# Patient Record
Sex: Male | Born: 1990 | Race: Black or African American | Hispanic: No | Marital: Married | Smoking: Never smoker
Health system: Southern US, Community
[De-identification: ages and names within clinical notes are randomized; demographics above are authoritative.]

## PROBLEM LIST (undated history)

## (undated) DIAGNOSIS — I10 Essential (primary) hypertension: Secondary | ICD-10-CM

## (undated) DIAGNOSIS — M25579 Pain in unspecified ankle and joints of unspecified foot: Secondary | ICD-10-CM

## (undated) DIAGNOSIS — E669 Obesity, unspecified: Secondary | ICD-10-CM

## (undated) DIAGNOSIS — J020 Streptococcal pharyngitis: Secondary | ICD-10-CM

## (undated) DIAGNOSIS — G43909 Migraine, unspecified, not intractable, without status migrainosus: Secondary | ICD-10-CM

## (undated) DIAGNOSIS — R079 Chest pain, unspecified: Secondary | ICD-10-CM

## (undated) DIAGNOSIS — M7989 Other specified soft tissue disorders: Secondary | ICD-10-CM

## (undated) DIAGNOSIS — M549 Dorsalgia, unspecified: Secondary | ICD-10-CM

## (undated) HISTORY — PX: MOUTH SURGERY: SHX715

## (undated) HISTORY — DX: Pain in unspecified ankle and joints of unspecified foot: M25.579

## (undated) HISTORY — DX: Obesity, unspecified: E66.9

## (undated) HISTORY — DX: Other specified soft tissue disorders: M79.89

## (undated) HISTORY — DX: Chest pain, unspecified: R07.9

## (undated) HISTORY — DX: Dorsalgia, unspecified: M54.9

## (undated) HISTORY — DX: Essential (primary) hypertension: I10

---

## 2013-06-22 ENCOUNTER — Emergency Department (HOSPITAL_COMMUNITY)
Admission: EM | Admit: 2013-06-22 | Discharge: 2013-06-22 | Disposition: A | Discharge status: Home / Self Care | Payer: BC Managed Care – PPO | Attending: Emergency Medicine | Admitting: Emergency Medicine

## 2013-06-22 ENCOUNTER — Encounter (HOSPITAL_COMMUNITY): Payer: Self-pay | Admitting: Emergency Medicine

## 2013-06-22 DIAGNOSIS — J029 Acute pharyngitis, unspecified: Secondary | ICD-10-CM

## 2013-06-22 HISTORY — DX: Streptococcal pharyngitis: J02.0

## 2013-06-22 MED ORDER — IBUPROFEN 600 MG PO TABS: 600.0000 mg | ORAL_TABLET | Freq: Three times a day (TID) | ORAL | Status: DC | PRN

## 2013-06-22 MED ORDER — IBUPROFEN 200 MG PO TABS
600.0000 mg | ORAL_TABLET | Freq: Once | ORAL | Status: AC
  Administered 2013-06-22: 600 mg via ORAL
  Filled 2013-06-22: qty 3

## 2013-06-22 MED ORDER — IBUPROFEN 200 MG PO TABS
200.0000 mg | ORAL_TABLET | Freq: Once | ORAL | Status: DC
  Filled 2013-06-22: qty 1

## 2013-06-22 NOTE — ED Provider Notes (Signed)
CSN: [id number]     Arrival date & time 06/22/13  1006 History   First MD Initiated Contact with Patient 06/22/13 1220     Chief Complaint  Patient presents with  . Sore Throat   HPI Patient reports he was seen several days ago was diagnosed with strep throat and prescribed antibiotics.  He reports ongoing discomfort in his throat.  He also reports new developing right ear pain.  No difficulty breathing or swallowing.  No fevers or chills.  Symptoms are mild in severity.   Past Medical History  Diagnosis Date  . Strep throat    History reviewed. No pertinent past surgical history. Family History  Problem Relation Age of Onset  . Hypertension Mother    History  Substance Use Topics  . Smoking status: Never Smoker   . Smokeless tobacco: Never Used  . Alcohol Use: Yes     Comment: occasionally    Review of Systems  All other systems reviewed and are negative.    Allergies  Review of patient's allergies indicates no known allergies.  Home Medications   Current Outpatient Rx  Name  Route  Sig  Dispense  Refill  . amoxicillin (AMOXIL) 500 MG capsule   Oral   Take 1 capsule by mouth 4 (four) times daily.         Marland Kitchen ibuprofen (ADVIL,MOTRIN) 600 MG tablet   Oral   Take 1 tablet (600 mg total) by mouth every 8 (eight) hours as needed.   15 tablet   0    BP 127/61  Pulse 75  Temp(Src) 97.9 F (36.6 C) (Oral)  Resp 18  Ht 6' (1.829 m)  Wt 272 lb (123.378 kg)  BMI 36.88 kg/m2  SpO2 95% Physical Exam  Nursing note and vitals reviewed. Constitutional: He is oriented to person, place, and time. He appears well-developed and well-nourished.  HENT:  Head: Normocephalic and atraumatic.  Uvula midline.  Posterior pharynx is normal.  No tonsillar swelling or exudate.  Tolerating oral secretions.  Dentition is normal.  Tissue under his tongue is normal and soft.  Bilateral TMs are normal  Eyes: EOM are normal.  Neck: Normal range of motion. Neck supple. No JVD present. No  tracheal deviation present. No thyromegaly present.  Cardiovascular: Normal rate, regular rhythm, normal heart sounds and intact distal pulses.   Pulmonary/Chest: Effort normal and breath sounds normal. No stridor. No respiratory distress.  Abdominal: Soft. He exhibits no distension. There is no tenderness.  Musculoskeletal: Normal range of motion.  Lymphadenopathy:    He has no cervical adenopathy.  Neurological: He is alert and oriented to person, place, and time.  Skin: Skin is warm and dry.  Psychiatric: He has a normal mood and affect. Judgment normal.    ED Course  Procedures (including critical care time) Labs Review Labs Reviewed - No data to display Imaging Review No results found.  EKG Interpretation   None       MDM   1. Sore throat    Overall well appearing.  I do not think the patient needs any imaging of his neck.  Likely ongoing viral pharyngitis.  No signs of peritonsillar abscess at this time.    Lyanne Co, MD 06/22/13 [phone]

## 2013-06-22 NOTE — ED Notes (Signed)
Patient was diagnosed with strep throat 4 days ago and was prescribed antibiotics. Patient c/o no relief from meds given and states the sore throat is worse. Patient also c/o difficulty swallowing due to pain on the right side of throat and right ear.

## 2014-02-26 ENCOUNTER — Emergency Department (HOSPITAL_COMMUNITY)
Admission: EM | Admit: 2014-02-26 | Discharge: 2014-02-27 | Disposition: A | Discharge status: Home / Self Care | Payer: BC Managed Care – PPO | Attending: Emergency Medicine | Admitting: Emergency Medicine

## 2014-02-26 ENCOUNTER — Encounter (HOSPITAL_COMMUNITY): Payer: Self-pay | Admitting: Emergency Medicine

## 2014-02-26 DIAGNOSIS — M7150 Other bursitis, not elsewhere classified, unspecified site: Secondary | ICD-10-CM | POA: Diagnosis not present

## 2014-02-26 DIAGNOSIS — Y929 Unspecified place or not applicable: Secondary | ICD-10-CM | POA: Diagnosis not present

## 2014-02-26 DIAGNOSIS — Z8619 Personal history of other infectious and parasitic diseases: Secondary | ICD-10-CM | POA: Insufficient documentation

## 2014-02-26 DIAGNOSIS — M25519 Pain in unspecified shoulder: Secondary | ICD-10-CM | POA: Diagnosis not present

## 2014-02-26 DIAGNOSIS — X58XXXA Exposure to other specified factors, initial encounter: Secondary | ICD-10-CM | POA: Insufficient documentation

## 2014-02-26 DIAGNOSIS — Z79899 Other long term (current) drug therapy: Secondary | ICD-10-CM | POA: Diagnosis not present

## 2014-02-26 DIAGNOSIS — M7072 Other bursitis of hip, left hip: Secondary | ICD-10-CM

## 2014-02-26 DIAGNOSIS — T148XXA Other injury of unspecified body region, initial encounter: Secondary | ICD-10-CM | POA: Diagnosis not present

## 2014-02-26 DIAGNOSIS — M545 Low back pain, unspecified: Secondary | ICD-10-CM | POA: Diagnosis present

## 2014-02-26 DIAGNOSIS — Y939 Activity, unspecified: Secondary | ICD-10-CM | POA: Diagnosis not present

## 2014-02-26 NOTE — ED Notes (Signed)
Pt c/o low back pain, L hip pain after working x 5 days.

## 2014-02-27 MED ORDER — CYCLOBENZAPRINE HCL 10 MG PO TABS: 10.0000 mg | ORAL_TABLET | Freq: Three times a day (TID) | ORAL | Status: DC | PRN

## 2014-02-27 MED ORDER — MELOXICAM 15 MG PO TABS: 15.0000 mg | ORAL_TABLET | Freq: Every day | ORAL | Status: DC

## 2014-02-27 NOTE — Discharge Instructions (Signed)
You may use heat and ice over your sore areas. Use gentle stretching. Follow up with a primary care provider for continued evaluation and treatment.    Hip Bursitis Bursitis is a puffiness (swelling) and soreness of a fluid-filled sac (bursa). This sac covers and protects the joint. HOME CARE  Put ice on the injured area.  Put ice in a plastic bag.  Place a towel between your skin and the bag.  Leave the ice on for 15-20 minutes, 03-04 times a day.  Rest the painful joint as much as possible. Move your joint at least 4 times a day. When pain lessens, start normal, slow movements and normal activities.  Only take medicine as told by your doctor.  Use crutches as told.  Raise (elevate) your painful joint. Use pillows for propping your legs and hips.  Get a massage to lessen pain. GET HELP RIGHT AWAY IF:  Your pain increases or does not improve during treatment.  You have a fever.  You feel heat coming from the affected area.  You see redness and puffiness around the affected area.  You have any questions or concerns. MAKE SURE YOU:  Understand these instructions.  Will watch your condition.  Will get help right away if you are not well or get worse. Document Released: 08/04/2010 Document Revised: 09/24/2011 Document Reviewed: 08/04/2010 [facility] Patient Information 2015 [place], [place]. This information is not intended to replace advice given to you by your health care provider. Make sure you discuss any questions you have with your health care provider.    Muscle Strain A muscle strain is an injury that occurs when a muscle is stretched beyond its normal length. Usually a small number of muscle fibers are torn when this happens. Muscle strain is rated in degrees. First-degree strains have the least amount of muscle fiber tearing and pain. Second-degree and third-degree strains have increasingly more tearing and pain.  Usually, recovery from muscle strain takes 1-2  weeks. Complete healing takes 5-6 weeks.  CAUSES  Muscle strain happens when a sudden, violent force placed on a muscle stretches it too far. This may occur with lifting, sports, or a fall.  RISK FACTORS Muscle strain is especially common in athletes.  SIGNS AND SYMPTOMS At the site of the muscle strain, there may be:  Pain.  Bruising.  Swelling.  Difficulty using the muscle due to pain or lack of normal function. DIAGNOSIS  Your health care provider will perform a physical exam and ask about your medical history. TREATMENT  Often, the best treatment for a muscle strain is resting, icing, and applying cold compresses to the injured area.  HOME CARE INSTRUCTIONS   Use the PRICE method of treatment to promote muscle healing during the first 2-3 days after your injury. The PRICE method involves:  Protecting the muscle from being injured again.  Restricting your activity and resting the injured body part.  Icing your injury. To do this, put ice in a plastic bag. Place a towel between your skin and the bag. Then, apply the ice and leave it on from 15-20 minutes each hour. After the third day, switch to moist heat packs.  Apply compression to the injured area with a splint or elastic bandage. Be careful not to wrap it too tightly. This may interfere with blood circulation or increase swelling.  Elevate the injured body part above the level of your heart as often as you can.  Only take over-the-counter or prescription medicines for pain, discomfort, or  fever as directed by your health care provider.  Warming up prior to exercise helps to prevent future muscle strains. SEEK MEDICAL CARE IF:   You have increasing pain or swelling in the injured area.  You have numbness, tingling, or a significant loss of strength in the injured area. MAKE SURE YOU:   Understand these instructions.  Will watch your condition.  Will get help right away if you are not doing well or get  worse. Document Released: 07/02/2005 Document Revised: 04/22/2013 Document Reviewed: 01/29/2013 [facility] Patient Information 2015 [place], [place]. This information is not intended to replace advice given to you by your health care provider. Make sure you discuss any questions you have with your health care provider.

## 2014-02-27 NOTE — ED Provider Notes (Signed)
Medical screening examination/treatment/procedure(s) were performed by non-physician practitioner and as supervising physician I was immediately available for consultation/collaboration.   EKG Interpretation None       Derwood KaplanAnkit Dragan Tamburrino, MD 02/27/14 [phone]

## 2014-02-27 NOTE — ED Provider Notes (Signed)
CSN: [id number]     Arrival date & time 02/26/14  2334 History   First MD Initiated Contact with Patient 02/26/14 2351     Chief Complaint  Patient presents with  . Back Pain   HPI  Hx provided by pt. Pt is a 23 yo male with no sig. PMH who presents with complaints of left hip pains, low back pain and right shoulder pains. He has had left hip pains for the past 5 days. Pain is worse with movement and exercise. Pt states he has been jogging more recently prior to symptoms. Pain radiates some into low back area and he has some soreness with bending and moving. Pt also developed right shoulder pains over last 2 days. Pain is also worse with movements. Denies any heavy lifting or injury. 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  He has been taking hydrocodone left over from a recent oral procedure which has been helping with pains but they have not gone away. Pt will be starting classes soon and is anxious to have pains improved before then. Denies similar symptoms previously. No urinary complaints. No recent fevers.   Past Medical History  Diagnosis Date  . Strep throat    Past Surgical History  Procedure Laterality Date  . Mouth surgery     Family History  Problem Relation Age of Onset  . Hypertension Mother    History  Substance Use Topics  . Smoking status: Never Smoker   . Smokeless tobacco: Never Used  . Alcohol Use: Yes     Comment: occasionally    Review of Systems  Constitutional: Negative for fever and chills.  Gastrointestinal: Negative for nausea and abdominal pain.  Genitourinary: Negative for dysuria, frequency, hematuria and flank pain.  Musculoskeletal: Positive for arthralgias and back pain.  Neurological: Negative for weakness and numbness.  All other systems reviewed and are negative.     Allergies  Review of patient's allergies indicates no known allergies.  Home Medications   Prior to Admission medications   Medication Sig Start Date End  Date Taking? Authorizing Provider  ibuprofen (ADVIL,MOTRIN) 600 MG tablet Take 1 tablet (600 mg total) by mouth every 8 (eight) hours as needed. 06/22/13   Lyanne CoKevin M Campos, MD   BP 150/82  Pulse 74  Temp(Src) 98.1 F (36.7 C) (Oral)  Resp 15  Ht 6' (1.829 m)  Wt 264 lb (119.75 kg)  BMI 35.80 kg/m2  SpO2 99% Physical Exam  Nursing note and vitals reviewed. Constitutional: He appears well-developed and well-nourished. No distress.  HENT:  Head: Normocephalic.  Cardiovascular: Normal rate and regular rhythm.   Pulmonary/Chest: Effort normal and breath sounds normal. No respiratory distress.  Abdominal: Soft. There is no tenderness. There is no rebound and no guarding.  No CVA tenderness.  Musculoskeletal: Normal range of motion.  No pain with ROM of left hip. No TTP. Normal strength in all directions. Normal distal sensation and pulses.  Normal ROM of right shoulder. TTP over trapezious area. No spasm or deformity. Normal distal sensations and pulses. Normal strength.   No Low back tenderness. No deformity.  Neurological: He is alert.  Skin: Skin is warm.  Psychiatric: He has a normal mood and affect.    ED Course  Procedures   COORDINATION OF CARE:  Nursing notes reviewed. Vital signs reviewed. Initial pt interview and examination performed.   Filed Vitals:   02/26/14 2338  BP: 150/82  Pulse: 74  Temp: 98.1  F (36.7 C)  TempSrc: Oral  Resp: 15  Height: 6' (1.829 m)  Weight: 264 lb (119.75 kg)  SpO2: 99%    12:04 AM-Pt seen and evaluated. Pt does not appear in any acute distress. Pains seem musculoskeletal. No concerning or red flag symptoms. No injury. Discussed treatment plan with NSAID and muscle relaxer. Pt agrees with plan.       MDM   Final diagnoses:  Bursitis of left hip  Muscle strain       Angus Seller, PA-C 02/27/14 0015

## 2014-03-01 ENCOUNTER — Encounter (HOSPITAL_BASED_OUTPATIENT_CLINIC_OR_DEPARTMENT_OTHER): Payer: Self-pay | Admitting: Emergency Medicine

## 2014-06-05 ENCOUNTER — Emergency Department (HOSPITAL_COMMUNITY)
Admission: EM | Admit: 2014-06-05 | Discharge: 2014-06-05 | Disposition: A | Discharge status: Home / Self Care | Payer: BC Managed Care – PPO | Attending: Emergency Medicine | Admitting: Emergency Medicine

## 2014-06-05 ENCOUNTER — Encounter (HOSPITAL_COMMUNITY): Payer: Self-pay | Admitting: Emergency Medicine

## 2014-06-05 DIAGNOSIS — R05 Cough: Secondary | ICD-10-CM

## 2014-06-05 DIAGNOSIS — R590 Localized enlarged lymph nodes: Secondary | ICD-10-CM | POA: Diagnosis not present

## 2014-06-05 DIAGNOSIS — Z792 Long term (current) use of antibiotics: Secondary | ICD-10-CM | POA: Insufficient documentation

## 2014-06-05 DIAGNOSIS — J029 Acute pharyngitis, unspecified: Secondary | ICD-10-CM | POA: Diagnosis not present

## 2014-06-05 DIAGNOSIS — Z79899 Other long term (current) drug therapy: Secondary | ICD-10-CM | POA: Insufficient documentation

## 2014-06-05 DIAGNOSIS — R110 Nausea: Secondary | ICD-10-CM | POA: Insufficient documentation

## 2014-06-05 DIAGNOSIS — R059 Cough, unspecified: Secondary | ICD-10-CM

## 2014-06-05 MED ORDER — PREDNISONE 20 MG PO TABS: 40.0000 mg | ORAL_TABLET | Freq: Every day | ORAL | Status: DC

## 2014-06-05 MED ORDER — IBUPROFEN 600 MG PO TABS: 600.0000 mg | ORAL_TABLET | Freq: Four times a day (QID) | ORAL | Status: DC | PRN

## 2014-06-05 MED ORDER — HYDROCODONE-ACETAMINOPHEN 7.5-325 MG/15ML PO SOLN: 15.0000 mL | ORAL | Status: DC | PRN

## 2014-06-05 NOTE — ED Notes (Signed)
Pt c/o sore throat and cough x 1 week. +nausea, +diarrhea, dizziness.

## 2014-06-05 NOTE — Discharge Instructions (Signed)
Pharyngitis Pharyngitis is redness, pain, and swelling (inflammation) of your pharynx.  CAUSES  Pharyngitis is usually caused by infection. Most of the time, these infections are from viruses (viral) and are part of a cold. However, sometimes pharyngitis is caused by bacteria (bacterial). Pharyngitis can also be caused by allergies. Viral pharyngitis may be spread from person to person by coughing, sneezing, and personal items or utensils (cups, forks, spoons, toothbrushes). Bacterial pharyngitis may be spread from person to person by more intimate contact, such as kissing.  SIGNS AND SYMPTOMS  Symptoms of pharyngitis include:   Sore throat.   Tiredness (fatigue).   Low-grade fever.   Headache.  Joint pain and muscle aches.  Skin rashes.  Swollen lymph nodes.  Plaque-like film on throat or tonsils (often seen with bacterial pharyngitis). DIAGNOSIS  Your health care provider will ask you questions about your illness and your symptoms. Your medical history, along with a physical exam, is often all that is needed to diagnose pharyngitis. Sometimes, a rapid strep test is done. Other lab tests may also be done, depending on the suspected cause.  TREATMENT  Viral pharyngitis will usually get better in 3-4 days without the use of medicine. Bacterial pharyngitis is treated with medicines that kill germs (antibiotics).  HOME CARE INSTRUCTIONS   Drink enough water and fluids to keep your urine clear or pale yellow.   Only take over-the-counter or prescription medicines as directed by your health care provider:   If you are prescribed antibiotics, make sure you finish them even if you start to feel better.   Do not take aspirin.   Get lots of rest.   Gargle with 8 oz of salt water ( tsp of salt per 1 qt of water) as often as every 1-2 hours to soothe your throat.   Throat lozenges (if you are not at risk for choking) or sprays may be used to soothe your throat. SEEK MEDICAL  CARE IF:   You have large, tender lumps in your neck.  You have a rash.  You cough up green, yellow-brown, or bloody spit. SEEK IMMEDIATE MEDICAL CARE IF:   Your neck becomes stiff.  You drool or are unable to swallow liquids.  You vomit or are unable to keep medicines or liquids down.  You have severe pain that does not go away with the use of recommended medicines.  You have trouble breathing (not caused by a stuffy nose). MAKE SURE YOU:   Understand these instructions.  Will watch your condition.  Will get help right away if you are not doing well or get worse. Document Released: 07/02/2005 Document Revised: 04/22/2013 Document Reviewed: 03/09/2013 [facility] Patient Information 2015 [place], [place]. This information is not intended to replace advice given to you by your health care provider. Make sure you discuss any questions you have with your health care provider. Salt Water Gargle This solution will help make your mouth and throat feel better. HOME CARE INSTRUCTIONS   Mix 1 teaspoon of salt in 8 ounces of warm water.  Gargle with this solution as much or often as you need or as directed. Swish and gargle gently if you have any sores or wounds in your mouth.  Do not swallow this mixture. Document Released: 04/05/2004 Document Revised: 09/24/2011 Document Reviewed: 08/27/2008 [facility] Patient Information 2015 [place], [place]. This information is not intended to replace advice given to you by your health care provider. Make sure you discuss any questions you have with your health care provider.  Cough, Adult  A cough is a reflex that helps clear your throat and airways. It can help heal the body or may be a reaction to an irritated airway. A cough may only last 2 or 3 weeks (acute) or may last more than 8 weeks (chronic).  CAUSES Acute cough:  Viral or bacterial infections. Chronic cough:  Infections.  Allergies.  Asthma.  Post-nasal  drip.  Smoking.  Heartburn or acid reflux.  Some medicines.  Chronic lung problems (COPD).  Cancer. SYMPTOMS   Cough.  Fever.  Chest pain.  Increased breathing rate.  High-pitched whistling sound when breathing (wheezing).  Colored mucus that you cough up (sputum). TREATMENT   A bacterial cough may be treated with antibiotic medicine.  A viral cough must run its course and will not respond to antibiotics.  Your caregiver may recommend other treatments if you have a chronic cough. HOME CARE INSTRUCTIONS   Only take over-the-counter or prescription medicines for pain, discomfort, or fever as directed by your caregiver. Use cough suppressants only as directed by your caregiver.  Use a cold steam vaporizer or humidifier in your bedroom or home to help loosen secretions.  Sleep in a semi-upright position if your cough is worse at night.  Rest as needed.  Stop smoking if you smoke. SEEK IMMEDIATE MEDICAL CARE IF:   You have pus in your sputum.  Your cough starts to worsen.  You cannot control your cough with suppressants and are losing sleep.  You begin coughing up blood.  You have difficulty breathing.  You develop pain which is getting worse or is uncontrolled with medicine.  You have a fever. MAKE SURE YOU:   Understand these instructions.  Will watch your condition.  Will get help right away if you are not doing well or get worse. Document Released: 12/29/2010 Document Revised: 09/24/2011 Document Reviewed: 12/29/2010 [facility] Patient Information 2015 [place], [place]. This information is not intended to replace advice given to you by your health care provider. Make sure you discuss any questions you have with your health care provider.

## 2014-06-05 NOTE — ED Provider Notes (Signed)
CSN: [id number]     Arrival date & time 06/05/14  0055 History   First MD Initiated Contact with Patient 06/05/14 0100     Chief Complaint  Patient presents with  . Sore Throat    (Consider location/radiation/quality/duration/timing/severity/associated sxs/prior Treatment) HPI Comments: 23 year old male presents to the emergency department for further evaluation of sore throat. Patient states that symptoms have been gradually worsening times one week. He states that pain is worse with swallowing, but he denies inability to swallow or drooling. Symptoms preceded by coughing x 1.5 weeks. Patient states the cough is dry and nonproductive. He states that sometimes he coughs so much that he becomes nauseous and almost vomits. He denies fever, nasal congestion, rhinorrhea, chest pain, shortness of breath, and abdominal pain. No sick contacts. No meds taken for symptoms PTA.  Patient is a 23 y.o. male presenting with pharyngitis. The history is provided by the patient. No language interpreter was used.  Sore Throat This is a new problem. Episode onset: 1 week ago. The problem occurs constantly. The problem has been gradually worsening. Associated symptoms include coughing, nausea and a sore throat. Pertinent negatives include no abdominal pain, congestion, fever, myalgias, neck pain, rash or vomiting. The symptoms are aggravated by swallowing. He has tried nothing for the symptoms.    Past Medical History  Diagnosis Date  . Strep throat    Past Surgical History  Procedure Laterality Date  . Mouth surgery     Family History  Problem Relation Age of Onset  . Hypertension Mother    History  Substance Use Topics  . Smoking status: Never Smoker   . Smokeless tobacco: Never Used  . Alcohol Use: No    Review of Systems  Constitutional: Negative for fever.  HENT: Positive for sore throat. Negative for congestion, drooling, postnasal drip, rhinorrhea and trouble swallowing.   Respiratory:  Positive for cough. Negative for shortness of breath.   Gastrointestinal: Positive for nausea. Negative for vomiting and abdominal pain.  Musculoskeletal: Negative for myalgias and neck pain.  Skin: Negative for rash.  Neurological: Negative for syncope.  All other systems reviewed and are negative.   Allergies  Other  Home Medications   Prior to Admission medications   Medication Sig Start Date End Date Taking? Authorizing Provider  minocycline (MINOCIN,DYNACIN) 50 MG capsule Take 50 mg by mouth 2 (two) times daily.   Yes Historical Provider, MD  Phenyleph-CPM-DM-APAP (ALKA-SELTZER PLUS COLD & COUGH) 11-14-08-325 MG CAPS Take 2 tablets by mouth every 4 (four) hours as needed (cold symptoms).   Yes Historical Provider, MD  Pseudoeph-Doxylamine-DM-APAP (NYQUIL PO) Take 30 mLs by mouth at bedtime as needed (cold symptoms).   Yes Historical Provider, MD  Pseudoephedrine-APAP-DM (DAYQUIL PO) Take 2 tablets by mouth every 4 (four) hours as needed (cold symptoms).   Yes Historical Provider, MD  albuterol (PROVENTIL HFA;VENTOLIN HFA) 108 (90 BASE) MCG/ACT inhaler Inhale 1 puff into the lungs every 6 (six) hours as needed for wheezing or shortness of breath.    Historical Provider, MD  cyclobenzaprine (FLEXERIL) 10 MG tablet Take 1 tablet (10 mg total) by mouth 3 (three) times daily as needed for muscle spasms. Patient not taking: Reported on 06/05/2014 02/27/14   Phill MutterPeter S Dammen, PA-C  HYDROcodone-acetaminophen (HYCET) 7.5-325 mg/15 ml solution Take 15 mLs by mouth every 4 (four) hours as needed for moderate pain or severe pain (For sore throat). 06/05/14   Antony MaduraKelly Ashika Apuzzo, PA-C  ibuprofen (ADVIL,MOTRIN) 600 MG tablet Take 1 tablet (600 mg  total) by mouth every 6 (six) hours as needed. 06/05/14   Antony MaduraKelly [place], PA-C  meloxicam (MOBIC) 15 MG tablet Take 1 tablet (15 mg total) by mouth daily. Patient not taking: Reported on 06/05/2014 02/27/14   Phill MutterPeter S Dammen, PA-C  predniSONE (DELTASONE) 20 MG tablet Take 2  tablets (40 mg total) by mouth daily. 06/05/14   Antony MaduraKelly Theodosia Bahena, PA-C   BP 138/80 mmHg  Pulse 72  Temp(Src) 98.4 F (36.9 C) (Oral)  Resp 19  Ht 6' (1.829 m)  Wt 250 lb (113.399 kg)  BMI 33.90 kg/m2  SpO2 94%   Physical Exam  Constitutional: He is oriented to person, place, and time. He appears well-developed and well-nourished. No distress.  HENT:  Head: Normocephalic and atraumatic.  Mouth/Throat: No oropharyngeal exudate.  80 limited line. No trismus or stridor. Patient has mildly enlarged tonsils bilaterally; 1+. He has mild erythema to his bilateral tonsils without exudates. Oropharynx clear and patient tolerating secretions without difficulty.  Eyes: Conjunctivae and EOM are normal. No scleral icterus.  Neck: Normal range of motion. Neck supple.  Cardiovascular: Normal rate, regular rhythm and normal heart sounds.   Pulmonary/Chest: Effort normal and breath sounds normal. No respiratory distress. He has no wheezes. He has no rales.  Respirations even and unlabored. Lungs clear to auscultation bilaterally  Musculoskeletal: Normal range of motion.  Lymphadenopathy:    He has cervical adenopathy (mild anterior cervical, nontender).  Neurological: He is alert and oriented to person, place, and time. He exhibits normal muscle tone. Coordination normal.  Skin: Skin is warm and dry. No rash noted. He is not diaphoretic. No erythema. No pallor.  Psychiatric: He has a normal mood and affect. His behavior is normal.  Nursing note and vitals reviewed.   ED Course  Procedures (including critical care time) Labs Review Labs Reviewed - No data to display  Imaging Review No results found.   EKG Interpretation None      MDM   Final diagnoses:  Viral pharyngitis  Cough    23 year old male presents to the emergency department for further evaluation of sore throat. Symptoms preceded by cough 1.5 weeks which has been dry. Coughing as well as swallowing aggravates sore throat, per  patient. Patient's physical exam without evidence of peritonsillar abscess. No significant posterior oropharyngeal erythema. No posterior oropharyngeal or tonsillar exudates. Oropharynx is clear and patient is tolerating secretions without difficulty. His lungs are clear and respirations are even and unlabored.  Suspect symptoms to be secondary to viral illness as well as 1.5 weeks of coughing prior to onset of sore throat. Sore throat most c/w inflammation associated with irritation. Low probability for strep given Centor Criteria of 2 (probability 11-17%). Will manage as outpatient with Hycet, prednisone, and ibuprofen. Return precautions discussed and provided. Patient agreeable to plan with no unaddressed concerns.   Filed Vitals:   06/05/14 0100 06/05/14 0215  BP: 159/88 138/80  Pulse: 86 72  Temp: 98.1 F (36.7 C) 98.4 F (36.9 C)  TempSrc: Oral Oral  Resp: 20 19  Height: 6' (1.829 m)   Weight: 250 lb (113.399 kg)   SpO2: 100% 94%     Antony MaduraKelly Gautam Langhorst, PA-C 06/05/14 [id number]  Arby BarretteMarcy Pfeiffer, MD 06/06/14 [id number]

## 2014-10-20 ENCOUNTER — Emergency Department (HOSPITAL_COMMUNITY)
Admission: EM | Admit: 2014-10-20 | Discharge: 2014-10-20 | Disposition: A | Discharge status: Home / Self Care | Payer: Self-pay | Attending: Emergency Medicine | Admitting: Emergency Medicine

## 2014-10-20 ENCOUNTER — Emergency Department (HOSPITAL_COMMUNITY): Payer: Self-pay

## 2014-10-20 ENCOUNTER — Encounter (HOSPITAL_COMMUNITY): Payer: Self-pay | Admitting: Emergency Medicine

## 2014-10-20 DIAGNOSIS — J069 Acute upper respiratory infection, unspecified: Secondary | ICD-10-CM | POA: Insufficient documentation

## 2014-10-20 DIAGNOSIS — Z791 Long term (current) use of non-steroidal anti-inflammatories (NSAID): Secondary | ICD-10-CM | POA: Insufficient documentation

## 2014-10-20 DIAGNOSIS — Z792 Long term (current) use of antibiotics: Secondary | ICD-10-CM | POA: Insufficient documentation

## 2014-10-20 DIAGNOSIS — B9789 Other viral agents as the cause of diseases classified elsewhere: Secondary | ICD-10-CM

## 2014-10-20 DIAGNOSIS — Z79899 Other long term (current) drug therapy: Secondary | ICD-10-CM | POA: Insufficient documentation

## 2014-10-20 DIAGNOSIS — Z7952 Long term (current) use of systemic steroids: Secondary | ICD-10-CM | POA: Insufficient documentation

## 2014-10-20 MED ORDER — BENZONATATE 100 MG PO CAPS: 200.0000 mg | ORAL_CAPSULE | Freq: Once | ORAL | Status: DC

## 2014-10-20 MED ORDER — BENZONATATE 100 MG PO CAPS
200.0000 mg | ORAL_CAPSULE | Freq: Once | ORAL | Status: AC
  Administered 2014-10-20: 200 mg via ORAL
  Filled 2014-10-20: qty 2

## 2014-10-20 MED ORDER — BENZONATATE 100 MG PO CAPS: 100.0000 mg | ORAL_CAPSULE | Freq: Three times a day (TID) | ORAL | Status: DC | PRN

## 2014-10-20 NOTE — ED Provider Notes (Signed)
CSN: [id number]     Arrival date & time 10/20/14  [id number] History   First MD Initiated Contact with Patient 10/20/14 0459     Chief Complaint  Patient presents with  . Chest Pain  . Cough     (Consider location/radiation/quality/duration/timing/severity/associated sxs/prior Treatment) HPI  Shawn Gray is a 24 y.o. male with no significant past medical history presenting today with a viral URI like symptoms for the past 4-5 weeks. Patient states he reports emergency department because he has been coughing so much is now starting to make his chest hurt. He is concerned that this may be something else. He had a productive cough of green and yellow sputum during the interval. He admits to rhinorrhea and sick contacts with similar symptoms as well. He has had chills with no fever. Patient does not have a history of allergies. He has been taking over-the-counter cough medicine and Tylenol during the interval. He has no further complaints.   10 Systems reviewed and are negative for acute change except as noted in the HPI.    Past Medical History  Diagnosis Date  . Strep throat    Past Surgical History  Procedure Laterality Date  . Mouth surgery     Family History  Problem Relation Age of Onset  . Hypertension Mother    History  Substance Use Topics  . Smoking status: Never Smoker   . Smokeless tobacco: Never Used  . Alcohol Use: No    Review of Systems    Allergies  Other  Home Medications   Prior to Admission medications   Medication Sig Start Date End Date Taking? Authorizing Provider  albuterol (PROVENTIL HFA;VENTOLIN HFA) 108 (90 BASE) MCG/ACT inhaler Inhale 1 puff into the lungs every 6 (six) hours as needed for wheezing or shortness of breath.    Historical Provider, MD  cyclobenzaprine (FLEXERIL) 10 MG tablet Take 1 tablet (10 mg total) by mouth 3 (three) times daily as needed for muscle spasms. Patient not taking: Reported on 06/05/2014 02/27/14   Ivonne AndrewPeter Dammen, PA-C   HYDROcodone-acetaminophen (HYCET) 7.5-325 mg/15 ml solution Take 15 mLs by mouth every 4 (four) hours as needed for moderate pain or severe pain (For sore throat). 06/05/14   Antony MaduraKelly Humes, PA-C  ibuprofen (ADVIL,MOTRIN) 600 MG tablet Take 1 tablet (600 mg total) by mouth every 6 (six) hours as needed. 06/05/14   Antony MaduraKelly Humes, PA-C  meloxicam (MOBIC) 15 MG tablet Take 1 tablet (15 mg total) by mouth daily. Patient not taking: Reported on 06/05/2014 02/27/14   Ivonne AndrewPeter Dammen, PA-C  minocycline (MINOCIN,DYNACIN) 50 MG capsule Take 50 mg by mouth 2 (two) times daily.    Historical Provider, MD  Phenyleph-CPM-DM-APAP (ALKA-SELTZER PLUS COLD & COUGH) 11-14-08-325 MG CAPS Take 2 tablets by mouth every 4 (four) hours as needed (cold symptoms).    Historical Provider, MD  predniSONE (DELTASONE) 20 MG tablet Take 2 tablets (40 mg total) by mouth daily. 06/05/14   Antony MaduraKelly Humes, PA-C  Pseudoeph-Doxylamine-DM-APAP (NYQUIL PO) Take 30 mLs by mouth at bedtime as needed (cold symptoms).    Historical Provider, MD  Pseudoephedrine-APAP-DM (DAYQUIL PO) Take 2 tablets by mouth every 4 (four) hours as needed (cold symptoms).    Historical Provider, MD   BP 163/87 mmHg  Pulse 83  Temp(Src) 98.3 F (36.8 C) (Oral)  Resp 25  SpO2 100% Physical Exam  Constitutional: He is oriented to person, place, and time. Vital signs are normal. He appears well-developed and well-nourished.  Non-toxic appearance. He does  not appear ill. No distress.  HENT:  Head: Normocephalic and atraumatic.  Nose: Nose normal.  Mouth/Throat: Oropharynx is clear and moist. No oropharyngeal exudate.  Eyes: Conjunctivae and EOM are normal. Pupils are equal, round, and reactive to light. No scleral icterus.  Neck: Normal range of motion. Neck supple. No tracheal deviation, no edema, no erythema and normal range of motion present. No thyroid mass and no thyromegaly present.  Cardiovascular: Normal rate, regular rhythm, S1 normal, S2 normal, normal heart  sounds, intact distal pulses and normal pulses.  Exam reveals no gallop and no friction rub.   No murmur heard. Pulses:      Radial pulses are 2+ on the right side, and 2+ on the left side.       Dorsalis pedis pulses are 2+ on the right side, and 2+ on the left side.  Pulmonary/Chest: Effort normal and breath sounds normal. No respiratory distress. He has no wheezes. He has no rhonchi. He has no rales.  Abdominal: Soft. Normal appearance and bowel sounds are normal. He exhibits no distension, no ascites and no mass. There is no hepatosplenomegaly. There is no tenderness. There is no rebound, no guarding and no CVA tenderness.  Musculoskeletal: Normal range of motion. He exhibits no edema or tenderness.  Lymphadenopathy:    He has no cervical adenopathy.  Neurological: He is alert and oriented to person, place, and time. He has normal strength. No cranial nerve deficit or sensory deficit.  Skin: Skin is warm, dry and intact. No petechiae and no rash noted. He is not diaphoretic. No erythema. No pallor.  Psychiatric: He has a normal mood and affect. His behavior is normal. Judgment normal.  Nursing note and vitals reviewed.   ED Course  Procedures (including critical care time) Labs Review Labs Reviewed - No data to display  Imaging Review Dg Chest 2 View  10/20/2014   CLINICAL DATA:  Nonsmoker with productive cough for 3 weeks. New onset Mid chest pain for a few days.  EXAM: CHEST  2 VIEW  COMPARISON:  None.  FINDINGS: Shallow inspiration. Normal heart size and pulmonary vascularity. No focal airspace disease or consolidation in the lungs. No blunting of costophrenic angles. No pneumothorax. Mediastinal contours appear intact.  IMPRESSION: No active cardiopulmonary disease.   Electronically Signed   By: Burman Nieves M.D.   On: 10/20/2014 05:52     EKG Interpretation None      MDM   Final diagnoses:  None    Patient presents emergency department viral URI-like symptoms for the  past 4-5 weeks. He presents because he now has chest pain with his cough. I suspect the patient continues to have a viral upper respiratory infection. Will obtain chest x-ray to ensure there is no pneumonia as the patient has presented for evaluation. He was given Jerilynn Som for his cough. He'll be sent home with a prescription for the same and instructed to only use if it helps.  His vital signs remain within his normal limits and he is safe for Dc with PCP fu within 3days.    Tomasita Crumble, MD 10/20/14 [phone]

## 2014-10-20 NOTE — Discharge Instructions (Signed)
Upper Respiratory Infection, Adult Shawn Gray, your chest xray does not show pneumonia.  You likely have an upper respiratory infection caused by a virus. Continue to take Motrin and Tylenol as needed for pain. See a primary care physician within 3 days for follow-up. If your symptoms worsen come back to emergency department immediately. Thank you. An upper respiratory infection (URI) is also known as the common cold. It is often caused by a type of germ (virus). Colds are easily spread (contagious). You can pass it to others by kissing, coughing, sneezing, or drinking out of the same glass. Usually, you get better in 1 or 2 weeks.  HOME CARE   Only take medicine as told by your doctor.  Use a warm mist humidifier or breathe in steam from a hot shower.  Drink enough water and fluids to keep your pee (urine) clear or pale yellow.  Get plenty of rest.  Return to work when your temperature is back to normal or as told by your doctor. You may use a face mask and wash your hands to stop your cold from spreading. GET HELP RIGHT AWAY IF:   After the first few days, you feel you are getting worse.  You have questions about your medicine.  You have chills, shortness of breath, or brown or red spit (mucus).  You have yellow or brown snot (nasal discharge) or pain in the face, especially when you bend forward.  You have a fever, puffy (swollen) neck, pain when you swallow, or white spots in the back of your throat.  You have a bad headache, ear pain, sinus pain, or chest pain.  You have a high-pitched whistling sound when you breathe in and out (wheezing).  You have a lasting cough or cough up blood.  You have sore muscles or a stiff neck. MAKE SURE YOU:   Understand these instructions.  Will watch your condition.  Will get help right away if you are not doing well or get worse. Document Released: 12/19/2007 Document Revised: 09/24/2011 Document Reviewed: 10/07/2013 [facility] Patient  Information 2015 [employer] [place]. This information is not intended to replace advice given to you by your health care provider. Make sure you discuss any questions you have with your health care provider.

## 2014-10-20 NOTE — ED Notes (Signed)
Pt reports productive cough x3 weeks with green and yellow sputum. Pt reports centralized chest pain x2 days when he goes into coughing spell and even when he is just sitting still. Pt is alert and oriented.

## 2015-06-29 ENCOUNTER — Emergency Department (HOSPITAL_COMMUNITY): Payer: BLUE CROSS/BLUE SHIELD

## 2015-06-29 ENCOUNTER — Encounter (HOSPITAL_COMMUNITY): Payer: Self-pay | Admitting: Emergency Medicine

## 2015-06-29 ENCOUNTER — Emergency Department (HOSPITAL_COMMUNITY)
Admission: EM | Admit: 2015-06-29 | Discharge: 2015-06-29 | Disposition: A | Discharge status: Home / Self Care | Payer: BLUE CROSS/BLUE SHIELD | Attending: Emergency Medicine | Admitting: Emergency Medicine

## 2015-06-29 DIAGNOSIS — Z8619 Personal history of other infectious and parasitic diseases: Secondary | ICD-10-CM | POA: Diagnosis not present

## 2015-06-29 DIAGNOSIS — G43909 Migraine, unspecified, not intractable, without status migrainosus: Secondary | ICD-10-CM | POA: Diagnosis not present

## 2015-06-29 DIAGNOSIS — Z8709 Personal history of other diseases of the respiratory system: Secondary | ICD-10-CM | POA: Diagnosis not present

## 2015-06-29 DIAGNOSIS — R110 Nausea: Secondary | ICD-10-CM

## 2015-06-29 DIAGNOSIS — Z79899 Other long term (current) drug therapy: Secondary | ICD-10-CM | POA: Insufficient documentation

## 2015-06-29 DIAGNOSIS — H6691 Otitis media, unspecified, right ear: Secondary | ICD-10-CM | POA: Insufficient documentation

## 2015-06-29 HISTORY — DX: Migraine, unspecified, not intractable, without status migrainosus: G43.909

## 2015-06-29 MED ORDER — DIPHENHYDRAMINE HCL 50 MG/ML IJ SOLN
50.0000 mg | Freq: Once | INTRAMUSCULAR | Status: AC
  Administered 2015-06-29: 50 mg via INTRAVENOUS
  Filled 2015-06-29: qty 1

## 2015-06-29 MED ORDER — SODIUM CHLORIDE 0.9 % IV BOLUS (SEPSIS)
1000.0000 mL | Freq: Once | INTRAVENOUS | Status: AC
  Administered 2015-06-29: 1000 mL via INTRAVENOUS

## 2015-06-29 MED ORDER — KETOROLAC TROMETHAMINE 30 MG/ML IJ SOLN
30.0000 mg | Freq: Once | INTRAMUSCULAR | Status: AC
  Administered 2015-06-29: 30 mg via INTRAVENOUS
  Filled 2015-06-29: qty 1

## 2015-06-29 MED ORDER — DIPHENHYDRAMINE HCL 25 MG PO TABS: 25.0000 mg | ORAL_TABLET | Freq: Four times a day (QID) | ORAL | Status: DC

## 2015-06-29 MED ORDER — METOCLOPRAMIDE HCL 10 MG PO TABS: 10.0000 mg | ORAL_TABLET | Freq: Four times a day (QID) | ORAL | Status: DC | PRN

## 2015-06-29 MED ORDER — AMOXICILLIN 500 MG PO CAPS: 500.0000 mg | ORAL_CAPSULE | Freq: Three times a day (TID) | ORAL | Status: DC

## 2015-06-29 MED ORDER — PROCHLORPERAZINE EDISYLATE 5 MG/ML IJ SOLN
10.0000 mg | Freq: Four times a day (QID) | INTRAMUSCULAR | Status: DC | PRN
  Administered 2015-06-29: 10 mg via INTRAVENOUS
  Filled 2015-06-29: qty 2

## 2015-06-29 MED ORDER — NAPROXEN 500 MG PO TABS: 500.0000 mg | ORAL_TABLET | Freq: Two times a day (BID) | ORAL | Status: DC

## 2015-06-29 MED ORDER — METHYLPREDNISOLONE SODIUM SUCC 125 MG IJ SOLR
125.0000 mg | Freq: Once | INTRAMUSCULAR | Status: AC
Start: 2015-06-29 — End: 2015-06-29
  Administered 2015-06-29: 125 mg via INTRAVENOUS
  Filled 2015-06-29: qty 2

## 2015-06-29 NOTE — ED Provider Notes (Signed)
Patient seen and examined. Discussed with Sarita HaverSean Julie PA-C. Patient reports long history of migraines. Had a several year symptom free interval before headaches recurred in the last few months. Current headache started 5 days ago. Resolved with migraine cocktail here. No indications for neuro imaging or additional diagnostics. He is symptom-free. No concerning or red flag symptoms on history or exam. He'll be discharged home with Reglan/Benadryl/naproxen to use when necessary at home with recurrence.  Rolland PorterMark Jenese Mischke, MD 06/29/15 1250

## 2015-06-29 NOTE — ED Provider Notes (Signed)
CSN: [id number]     Arrival date & time 06/29/15  1022 History   First MD Initiated Contact with Patient 06/29/15 1114     Chief Complaint  Patient presents with  . Migraine     (Consider location/radiation/quality/duration/timing/severity/associated sxs/prior Treatment) HPI   Shawn Gray is a 24 y.o. male, with a history of migraines, presenting to the ED with a migraine headache for the past 5 days. Pain is located behind patient's eyes and forehead, bilaterally, 6/10, throbbing, non-radiating.  Pt also complains of nausea, dizziness (room spinning), and photophobia.  Rest/sleep sometimes makes the pain better. Patient states that this headache feels like his previous headaches. Pt has tried a single dose of ibuprofen two days ago that didn't help much. Pt adds that he has had pain in the right ear for the past 5 days as well. Pt rates the ear pain at 5/10, throbbing, non-radiating. Pt was a patient of [facility], but can't remember when he was there last. Pt was on topiramate and an antiemetic whose description sounds like sublingual zofran. Pt denies fever/chills, vomiting, abdominal pain, neuro deficits, visual changes, or any other pain or complaints.     Past Medical History  Diagnosis Date  . Strep throat   . Migraine    Past Surgical History  Procedure Laterality Date  . Mouth surgery     Family History  Problem Relation Age of Onset  . Hypertension Mother    Social History  Substance Use Topics  . Smoking status: Never Smoker   . Smokeless tobacco: Never Used  . Alcohol Use: No    Review of Systems  Constitutional: Negative for fever, diaphoresis and fatigue.  HENT: Positive for ear pain.   Eyes: Positive for photophobia.  Gastrointestinal: Positive for nausea. Negative for vomiting.  Neurological: Positive for dizziness and headaches. Negative for seizures, syncope, facial asymmetry, speech difficulty, weakness and numbness.  All other systems reviewed  and are negative.     Allergies  Other and Nickel  Home Medications   Prior to Admission medications   Medication Sig Start Date End Date Taking? Authorizing Provider  Acetaminophen-DM (TYLENOL COUGH/SORE THROAT) 1000-30 MG/30ML LIQD Take 1 Dose by mouth 2 (two) times daily as needed (cold symptoms).   Yes Historical Provider, MD  Bioflavonoid Products (VITAMIN C) CHEW Chew 2 tablets by mouth daily.   Yes Historical Provider, MD  minocycline (MINOCIN,DYNACIN) 100 MG capsule Take 1 capsule by mouth 2 (two) times daily. 05/03/15  Yes Historical Provider, MD  OVER THE COUNTER MEDICATION Apply 1 application topically daily as needed (rash / dry skin). kiti kiti cream   Yes Historical Provider, MD  amoxicillin (AMOXIL) 500 MG capsule Take 1 capsule (500 mg total) by mouth 3 (three) times daily. 06/29/15   Rolland Porter, MD  diphenhydrAMINE (BENADRYL) 25 MG tablet Take 1 tablet (25 mg total) by mouth every 6 (six) hours. 06/29/15   Rolland Porter, MD  metoCLOPramide (REGLAN) 10 MG tablet Take 1 tablet (10 mg total) by mouth every 6 (six) hours as needed for nausea (Headache. take with benadryl and Naprosyn). 06/29/15   Rolland Porter, MD  naproxen (NAPROSYN) 500 MG tablet Take 1 tablet (500 mg total) by mouth 2 (two) times daily. 06/29/15   Rolland Porter, MD   BP 118/69 mmHg  Pulse 69  Temp(Src) 98.4 F (36.9 C) (Oral)  Resp 16  SpO2 100% Physical Exam  Constitutional: He appears well-developed and well-nourished. No distress.  HENT:  Head: Normocephalic and atraumatic.  Right Ear: Hearing, external ear and ear canal normal. Tympanic membrane is erythematous.  Left Ear: Hearing, tympanic membrane, external ear and ear canal normal.  Eyes: Conjunctivae are normal. Pupils are equal, round, and reactive to light.  Neck: Normal range of motion. Neck supple.  Cardiovascular: Normal rate, regular rhythm, normal heart sounds and intact distal pulses.   Pulmonary/Chest: Effort normal and breath sounds  normal. No respiratory distress.  Musculoskeletal: Normal range of motion.  Full ROM in all extremities and spine. No paraspinal tenderness.   Lymphadenopathy:    He has no cervical adenopathy.  Neurological: He is alert. He has normal reflexes.  No sensory deficits. Strength 5/5 in all extremities. No gait disturbance. Cranial nerves III-XII grossly intact. No facial droop.  Skin: Skin is warm and dry. He is not diaphoretic.  Psychiatric: He has a normal mood and affect. His behavior is normal.  Nursing note and vitals reviewed.   ED Course  Procedures (including critical care time) Labs Review Labs Reviewed - No data to display  Imaging Review No results found. I have personally reviewed and evaluated these images and lab results as part of my medical decision-making.   EKG Interpretation None      MDM   Final diagnoses:  Migraine without status migrainosus, not intractable, unspecified migraine type  Nausea  Acute right otitis media, recurrence not specified, unspecified otitis media type    Shawn Gray presents with migraine headache for the last 5 days associated with nausea, photophobia, and ear pain.  Findings and plan of care discussed with [name], MD.  Migraine and otitis media. Patient has no neurologic deficits or facial droop. Patient has no red flag symptoms. Suspect that the dizziness could be due to dehydration or from the ear infection. Upon reassessment patient states that his pain has gone down to a much more manageable level of 3 out of 10. His nausea and dizziness are gone. And Dr. Fayrene FearingJames spoke with this patient , patient told him that he is now pain-free, that this headache is consistent with his previous headaches, and is ready for discharge. CT scan was canceled.  Anselm PancoastShawn C Tkai Large, PA-C 06/29/15 1303  [name], MD 06/30/15 [phone]

## 2015-06-29 NOTE — ED Notes (Signed)
Pt w/ extensive hx of migraines c/o migraine x 5 days w/ associated nausea, sensitivity to light.

## 2015-06-29 NOTE — Discharge Instructions (Signed)
You have been seen today for headache and ear pain. Your imaging and lab tests showed no abnormalities. Follow up with PCP as needed. Return to ED should symptoms worsen. Follow up with neurology as soon as possible for chronic management of your migraines. Please take all of your antibiotics until finished!   You may develop abdominal discomfort or diarrhea from the antibiotic.  You may help offset this with probiotics which you can buy or get in yogurt. Do not eat or take the probiotics until 2 hours after your antibiotic.     Emergency Department Resource Guide 1) Find a Doctor and Pay Out of Pocket Although you won't have to find out who is covered by your insurance plan, it is a good idea to ask around and get recommendations. You will then need to call the office and see if the doctor you have chosen will accept you as a new patient and what types of options they offer for patients who are self-pay. Some doctors offer discounts or will set up payment plans for their patients who do not have insurance, but you will need to ask so you aren't surprised when you get to your appointment.  2) Contact Your Local Health Department Not all health departments have doctors that can see patients for sick visits, but many do, so it is worth a call to see if yours does. If you don't know where your local health department is, you can check in your phone book. The CDC also has a tool to help you locate your state's health department, and many state websites also have listings of all of their local health departments.  3) Find a Walk-in Clinic If your illness is not likely to be very severe or complicated, you may want to try a walk in clinic. These are popping up all over the country in pharmacies, drugstores, and shopping centers. They're usually staffed by nurse practitioners or physician assistants that have been trained to treat common illnesses and complaints. They're usually fairly quick and inexpensive.  However, if you have serious medical issues or chronic medical problems, these are probably not your best option.  No Primary Care Doctor: - Call Health Connect at  [phone] - they can help you locate a primary care doctor that  accepts your insurance, provides certain services, etc. - Physician Referral Service- [phone]  Chronic Pain Problems: Organization         Address  Phone   Notes  [facility]  [phone] Patients need to be referred by their primary care doctor.   Medication Assistance: Organization         Address  Phone   Notes  [facility] [address] [place], [place] [address] [phone] --Must be a resident of [facility] [place] -- Must have NO insurance coverage whatsoever (no Medicaid/ Medicare, etc.) -- The pt. MUST have a primary care doctor that directs their care regularly and follows them in the community   MedAssist  [phone]   [employer]  [phone]    Agencies that provide inexpensive medical care: Organization         Address  Phone   Notes  [facility]  [phone]   [facility]    [phone]   [facility] [address] [place], [place] [address] [phone]   [facility] [address], [place] [phone]  Planned Parenthood    [phone]   [facility]    [phone]   Community Health and [place] [address], [place] Phone:  [phone], Fax:  [phone] Hours of Operation:  9 am - 6 pm, M-F.  Also accepts Medicaid/Medicare and self-pay.  [facility] for [place], [address], [place] Phone: [phone], Fax: [phone]. Hours of Operation:  8:30 am - 5:30 pm, M-F.  Also accepts Medicaid and self-pay.  [facility] [place] [address], [place] Phone: [phone]   [place], [place], [place] [phone], Ext. [phone] Mondays & Thursdays: 7-9 AM.  First 15 patients are seen on a first come, first serve basis.    [place] Providers:  Organization         Address  Phone   Notes  [facility] [address], [place] [phone] Also accepts self-pay patients.  [facility] [address], [place]  [phone]   [place], [address], [place] [phone]   [facility] [address], [place] [phone]   Lucianne Lei [address], [place]   [phone] Only accepts [place] Access [place] patients after they have their name applied to their card.   Self-Pay (no insurance) in [facility]:  Organization         Address  Phone   Notes  Sickle Cell Patients, [facility] [place] [phone]   [facility] [place] [phone]   [facility] [place]  [place], [place], [place] [phone]   [facility]/Dr. Osei-Bonsu  [address], [place] or [address], [place] [phone] Phone number for both [place] and [place] locations is the same.  [facility] [address], [place] [phone]   [facility] [address] [place] or [address] [phone]   [facility] [address] [place] [phone], phone; [phone], fax Sees patients 1st and 3rd Saturday of every month.  Must not qualify for public or private insurance (i.e. Medicaid, Medicare, [place] Health Choice, Veterans' Benefits)  Household income should be no more than 200% of the poverty level The clinic cannot treat you if you are pregnant or think  you are pregnant  Sexually transmitted diseases are not treated at the clinic.    Dental Care: Organization         Address  Phone  Notes  [facility] Department of [facility] [place] [phone] Accepts children up to age 21 who are enrolled in [place] or [place]; pregnant women with a Medicaid card; and children who have applied for Medicaid or [place] Health Choice, but were declined, whose parents can pay a reduced fee at time of service.  [facility]  [address], [place] [phone] Accepts children up to age 54 who are enrolled in [place] or [place]; pregnant women with a Medicaid card; and children who have applied for Medicaid or [place], but were declined,  whose parents can pay a reduced fee at time of service.  [facility]  [place] [phone] Patients are seen by appointment only. Walk-ins are not accepted. [place] will see patients 81 years of age and older. Monday - Tuesday (8am-5pm) Most Wednesdays (8:30-5pm) $30 per visit, cash only  [facility]  [address], [facility] [phone] Patients are seen by appointment only. Walk-ins are not accepted. [place] will see patients 73 years of age and older. One Wednesday Evening (Monthly: Volunteer Based).  $30 per visit, cash only  [place]  [phone] for adults; Children under age 38, call Graduate Pediatric Dentistry at [phone]. Children aged 33-14, please call [phone] to request a pediatric application.  Dental services are provided in all areas of dental care including fillings, crowns and bridges, complete and partial dentures, implants, gum treatment, root canals, and extractions. Preventive care is also provided. Treatment is provided to both adults and  children. Patients are selected via a lottery and there is often a waiting list.   [facility] [address] [place]  [phone] [email or web address]   [facility] [address] [place], [place] [phone], Ext. [phone] Second and Fourth Thursday of each month, opens at 6:30 AM; Clinic ends at 9 AM.  Patients are seen on a first-come first-served basis, and a limited number are seen during each clinic.   [facility]  [address] [place]er [place], [place] [phone]   Eligibility Requirements You must have lived in [place], [place], or [place] counties for at least the last three months.   You cannot be eligible for state or federal sponsored [employer], including [employer], [place], or [employer].   You generally cannot be eligible for healthcare insurance through your employer.    How to apply: Eligibility screenings are held every Tuesday and Wednesday afternoon from 1:00 pm until 4:00 pm. You do not need an appointment for the interview!  [facility] [address] [place], [place]   [place]  [place] Department  [place]  [phone]    Behavioral Health Resources in the Community: Intensive Outpatient Programs Organization         Address  Phone  Notes  [place]. [address] [place], [place] [phone]   [facility] Outpatient [address], [place], [place]   ADS: Alcohol & Drug Svcs [address], [place], [place]   [place] [address]  [place], [place] or [phone]   Substance Abuse Resources Organization         Address  Phone  Notes  Alcohol and Drug Services  [phone]   [place]  [phone]   The [place]     Chinita Pester  [phone]   Residential & Outpatient Substance Abuse Program  [phone]   Psychological Services Organization         Address  Phone  Notes  [facility] [place]  [place]  [phone]   [place] [address], [place] or [phone]    Mobile Crisis Teams Organization         Address  Phone  Notes  [facility],  Mobile Crisis Care Unit  [phone]   [facility]ive Psychotherapeutic Services  [address] [place], [place]   [facility] [address], [place] Advance [phone]    Self-Help/Support Groups Organization         Address  Phone             Notes  [place]. of [place] - variety of support groups  [place] Call for more information  Narcotics Anonymous (NA), [facility]aring [facility] [address], [employer] [place]  2 meetings at this location   Special educational needs teacher         Address  Phone  Notes  [facility] [place],    [place]  [phone]   [facility]  [address] [place] [address]0086, [place], [place]   [place], [place] [phone] Admissions: 8am-3pm M-F  Incentives Substance [facility] [address],    [place], [place] [phone]   [facility] [address] [place], [place], [place]   [facility] [address],  [place], [place]   [facility]ight Programs - Intensive Outpatient [address], [place], [place]   [facility] [address]  [place], [place] [phone] or [phone]   [facility] (RTS) [address], [place], [place] Accepts Medicaid  [facility] [place] [address]  [place] [phone] Substance Abuse/Addiction Treatment   [facility] Organization         Address  Phone  Notes  [facility]  [phone]   Domenic Schwab, PhD [address] [place], [place]   [phone] or [phone]   [place] [place], [place] [phone]   [facility] [address], [place], [place] [phone] Insurance/Medicaid/sponsorship through [facility] and Families [address] [place]                                    [place], [place] [phone] [place] [address], [place] [phone]    Dr. Adele Schilder  [phone]   [facility] Dept. 1) [address] [place] 2) [place] 3)  [place] [address], [place] [phone]  [phone]   [place] [phone] or [phone] (After Hours)

## 2015-08-23 ENCOUNTER — Emergency Department (HOSPITAL_COMMUNITY)
Admission: EM | Admit: 2015-08-23 | Discharge: 2015-08-23 | Disposition: A | Discharge status: Home / Self Care | Payer: BLUE CROSS/BLUE SHIELD | Attending: Emergency Medicine | Admitting: Emergency Medicine

## 2015-08-23 ENCOUNTER — Encounter (HOSPITAL_COMMUNITY): Payer: Self-pay | Admitting: Emergency Medicine

## 2015-08-23 DIAGNOSIS — Z8709 Personal history of other diseases of the respiratory system: Secondary | ICD-10-CM | POA: Insufficient documentation

## 2015-08-23 DIAGNOSIS — R05 Cough: Secondary | ICD-10-CM | POA: Insufficient documentation

## 2015-08-23 DIAGNOSIS — R112 Nausea with vomiting, unspecified: Secondary | ICD-10-CM | POA: Diagnosis present

## 2015-08-23 DIAGNOSIS — R0602 Shortness of breath: Secondary | ICD-10-CM | POA: Insufficient documentation

## 2015-08-23 DIAGNOSIS — R1030 Lower abdominal pain, unspecified: Secondary | ICD-10-CM | POA: Insufficient documentation

## 2015-08-23 DIAGNOSIS — J029 Acute pharyngitis, unspecified: Secondary | ICD-10-CM | POA: Diagnosis not present

## 2015-08-23 DIAGNOSIS — R197 Diarrhea, unspecified: Secondary | ICD-10-CM | POA: Insufficient documentation

## 2015-08-23 DIAGNOSIS — R0981 Nasal congestion: Secondary | ICD-10-CM | POA: Insufficient documentation

## 2015-08-23 DIAGNOSIS — R6889 Other general symptoms and signs: Secondary | ICD-10-CM

## 2015-08-23 DIAGNOSIS — Z8679 Personal history of other diseases of the circulatory system: Secondary | ICD-10-CM | POA: Diagnosis not present

## 2015-08-23 DIAGNOSIS — Z792 Long term (current) use of antibiotics: Secondary | ICD-10-CM | POA: Insufficient documentation

## 2015-08-23 DIAGNOSIS — R079 Chest pain, unspecified: Secondary | ICD-10-CM | POA: Diagnosis not present

## 2015-08-23 LAB — CBC
HCT: 46.7 % (ref 39.0–52.0)
Hemoglobin: 15.7 g/dL (ref 13.0–17.0)
MCH: 28 pg (ref 26.0–34.0)
MCHC: 33.6 g/dL (ref 30.0–36.0)
MCV: 83.2 fL (ref 78.0–100.0)
PLATELETS: 318 10*3/uL (ref 150–400)
RBC: 5.61 MIL/uL (ref 4.22–5.81)
RDW: 13.2 % (ref 11.5–15.5)
WBC: 7.4 10*3/uL (ref 4.0–10.5)

## 2015-08-23 LAB — URINALYSIS, ROUTINE W REFLEX MICROSCOPIC
Glucose, UA: NEGATIVE mg/dL
HGB URINE DIPSTICK: NEGATIVE
Ketones, ur: NEGATIVE mg/dL
LEUKOCYTES UA: NEGATIVE
Nitrite: NEGATIVE
PROTEIN: NEGATIVE mg/dL
Specific Gravity, Urine: 1.028 (ref 1.005–1.030)
pH: 6 (ref 5.0–8.0)

## 2015-08-23 LAB — COMPREHENSIVE METABOLIC PANEL
ALK PHOS: 91 U/L (ref 38–126)
ALT: 32 U/L (ref 17–63)
AST: 25 U/L (ref 15–41)
Albumin: 4.5 g/dL (ref 3.5–5.0)
Anion gap: 12 (ref 5–15)
BUN: 9 mg/dL (ref 6–20)
CALCIUM: 10 mg/dL (ref 8.9–10.3)
CO2: 22 mmol/L (ref 22–32)
Chloride: 107 mmol/L (ref 101–111)
Creatinine, Ser: 1 mg/dL (ref 0.61–1.24)
Glucose, Bld: 97 mg/dL (ref 65–99)
Potassium: 3.8 mmol/L (ref 3.5–5.1)
Sodium: 141 mmol/L (ref 135–145)
Total Bilirubin: 0.7 mg/dL (ref 0.3–1.2)
Total Protein: 7.9 g/dL (ref 6.5–8.1)

## 2015-08-23 LAB — LIPASE, BLOOD: Lipase: 23 U/L (ref 11–51)

## 2015-08-23 MED ORDER — ONDANSETRON HCL 4 MG/2ML IJ SOLN
4.0000 mg | Freq: Once | INTRAMUSCULAR | Status: AC
  Administered 2015-08-23: 4 mg via INTRAVENOUS
  Filled 2015-08-23: qty 2

## 2015-08-23 MED ORDER — PROMETHAZINE HCL 25 MG PO TABS: 25.0000 mg | ORAL_TABLET | Freq: Four times a day (QID) | ORAL | Status: DC | PRN

## 2015-08-23 NOTE — ED Notes (Signed)
Provided pt with ginger ale for PO challenge.  Pt tolerating well with no vomiting.

## 2015-08-23 NOTE — Discharge Instructions (Signed)
Viral Infections °A viral infection can be caused by different types of viruses. Most viral infections are not serious and resolve on their own. However, some infections may cause severe symptoms and may lead to further complications. °SYMPTOMS °Viruses can frequently cause: °· Minor sore throat. °· Aches and pains. °· Headaches. °· Runny nose. °· Different types of rashes. °· Watery eyes. °· Tiredness. °· Cough. °· Loss of appetite. °· Gastrointestinal infections, resulting in nausea, vomiting, and diarrhea. °These symptoms do not respond to antibiotics because the infection is not caused by bacteria. However, you might catch a bacterial infection following the viral infection. This is sometimes called a "superinfection." Symptoms of such a bacterial infection may include: °· Worsening sore throat with pus and difficulty swallowing. °· Swollen neck glands. °· Chills and a high or persistent fever. °· Severe headache. °· Tenderness over the sinuses. °· Persistent overall ill feeling (malaise), muscle aches, and tiredness (fatigue). °· Persistent cough. °· Yellow, green, or brown mucus production with coughing. °HOME CARE INSTRUCTIONS  °· Only take over-the-counter or prescription medicines for pain, discomfort, diarrhea, or fever as directed by your caregiver. °· Drink enough water and fluids to keep your urine clear or pale yellow. Sports drinks can provide valuable electrolytes, sugars, and hydration. °· Get plenty of rest and maintain proper nutrition. Soups and broths with crackers or rice are fine. °SEEK IMMEDIATE MEDICAL CARE IF:  °· You have severe headaches, shortness of breath, chest pain, neck pain, or an unusual rash. °· You have uncontrolled vomiting, diarrhea, or you are unable to keep down fluids. °· You or your child has an oral temperature above 102° F (38.9° C), not controlled by medicine. °· Your baby is older than 3 months with a rectal temperature of 102° F (38.9° C) or higher. °· Your baby is 3  months old or younger with a rectal temperature of 100.4° F (38° C) or higher. °MAKE SURE YOU:  °· Understand these instructions. °· Will watch your condition. °· Will get help right away if you are not doing well or get worse. °  °This information is not intended to replace advice given to you by your health care provider. Make sure you discuss any questions you have with your health care provider. °  °Document Released: 04/11/2005 Document Revised: 09/24/2011 Document Reviewed: 12/08/2014 °Elsevier Interactive Patient Education ©2016 Elsevier Inc. ° °

## 2015-08-23 NOTE — ED Notes (Signed)
Pt c/o 3/10 headache and increased nausea.  4 episodes of vomiting and 2 of diarrhea since yesterday.  No abdominal pain at this time. Vitals WNL.

## 2015-08-23 NOTE — Progress Notes (Addendum)
[facility] ED CM noted pt with coverage but no pcp listed  [facility] ED CM spoke with pt on how to obtain an in network pcp with insurance coverage via the customer service number or web site  Cm reviewed ED level of care for crisis/emergent services and community pcp level of care to manage continuous or chronic medical concerns.  The pt voiced understanding CM encouraged pt and discussed pt's responsibility to verify with pt's insurance carrier that any recommended medical provider offered by any emergency room or a hospital provider is within the carrier's network. The pt voiced understanding  Entered in d/c instructions  do not forget to check the web site and toll free number on your insurance card to find a doctor for follow up care Call on 08/24/2015 As needed

## 2015-08-23 NOTE — ED Notes (Signed)
Pt c/o nasal congestion, runny nose, cough, sore throat x 1 wk.  States that he began having NVD on Sunday that has continued to today.

## 2015-08-23 NOTE — ED Provider Notes (Signed)
CSN: [id number]     Arrival date & time 08/23/15  1319 History   First MD Initiated Contact with Patient 08/23/15 1636     Chief Complaint  Patient presents with  . Nausea  . Emesis  . Diarrhea     (Consider location/radiation/quality/duration/timing/severity/associated sxs/prior Treatment) HPI   25 year old male presenting with flu like sxs.  Sxs started 1 week ago after sick contact at work.  Having subjected fever, feeling sick. Report congestion, cough, intermittent sob, sore throat, chest pain when cough. Four days ago pt developed n/v/d, having difficulty keeping fluid down.  Has 2 episodes of NBNB emesis today.  He is here today due to having pain when he coughs as well as low abdominal pain. States symptom is still persistent. She also is requesting for a work note. He denies any recent travel, eating exotic food, no prior history of PE or DVT. No history of alcohol abuse or tobacco abuse.  Past Medical History  Diagnosis Date  . Strep throat   . Migraine    Past Surgical History  Procedure Laterality Date  . Mouth surgery     Family History  Problem Relation Age of Onset  . Hypertension Mother    Social History  Substance Use Topics  . Smoking status: Never Smoker   . Smokeless tobacco: Never Used  . Alcohol Use: No    Review of Systems  All other systems reviewed and are negative.     Allergies  Other and Nickel  Home Medications   Prior to Admission medications   Medication Sig Start Date End Date Taking? Authorizing Provider  acetaminophen (TYLENOL) 500 MG tablet Take 500 mg by mouth every 6 (six) hours as needed.   Yes Historical Provider, MD  minocycline (MINOCIN,DYNACIN) 100 MG capsule Take 1 capsule by mouth 2 (two) times daily. 05/03/15  Yes Historical Provider, MD  OVER THE COUNTER MEDICATION Apply 1 application topically daily as needed (rash / dry skin). kiti kiti cream   Yes Historical Provider, MD  amoxicillin (AMOXIL) 500 MG capsule Take 1  capsule (500 mg total) by mouth 3 (three) times daily. Patient not taking: Reported on 08/23/2015 06/29/15   Rolland Porter, MD  diphenhydrAMINE (BENADRYL) 25 MG tablet Take 1 tablet (25 mg total) by mouth every 6 (six) hours. Patient not taking: Reported on 08/23/2015 06/29/15   Rolland Porter, MD  metoCLOPramide (REGLAN) 10 MG tablet Take 1 tablet (10 mg total) by mouth every 6 (six) hours as needed for nausea (Headache. take with benadryl and Naprosyn). Patient not taking: Reported on 08/23/2015 06/29/15   Rolland Porter, MD  naproxen (NAPROSYN) 500 MG tablet Take 1 tablet (500 mg total) by mouth 2 (two) times daily. Patient not taking: Reported on 08/23/2015 06/29/15   Rolland Porter, MD   BP 141/89 mmHg  Pulse 72  Temp(Src) 98.2 F (36.8 C) (Oral)  Resp 18  SpO2 100% Physical Exam  Constitutional: He appears well-developed and well-nourished. No distress.  HENT:  Head: Atraumatic.  Right Ear: External ear normal.  Left Ear: External ear normal.  Nose: Nose normal.  Mouth/Throat: Oropharynx is clear and moist.  Eyes: Conjunctivae are normal.  Neck: Normal range of motion. Neck supple.  Cardiovascular: Normal rate, regular rhythm and intact distal pulses.   Pulmonary/Chest: Effort normal and breath sounds normal. He exhibits no tenderness.  Abdominal: Soft. Bowel sounds are normal. He exhibits no distension. There is no tenderness.  Musculoskeletal: He exhibits no edema.  Neurological: He is alert.  Skin: No rash noted.  Psychiatric: He has a normal mood and affect.  Nursing note and vitals reviewed.   ED Course  Procedures (including critical care time) Labs Review Labs Reviewed  URINALYSIS, ROUTINE W REFLEX MICROSCOPIC (NOT AT [facility]) - Abnormal; Notable for the following:    Bilirubin Urine SMALL (*)    All other components within normal limits  LIPASE, BLOOD  COMPREHENSIVE METABOLIC PANEL  CBC    Imaging Review No results found. I have personally reviewed and evaluated these images and  lab results as part of my medical decision-making.   EKG Interpretation None      MDM   Final diagnoses:  Nausea vomiting and diarrhea  Flu-like symptoms    BP 126/78 mmHg  Pulse 79  Temp(Src) 98.2 F (36.8 C) (Oral)  Resp 14  SpO2 99%   5:05 PM Patient here with flulike symptoms lasting for the past week. He is well-appearing, does not look toxic. Does not look dehydrated. Headache is unremarkable. The IV fluid patient declined requesting only for antinausea medication at this time. No significant abdominal discomfort to suggest appendicitis or other acute abdominal pathology. His lungs clear, no hypoxia or fever, low suspicion for pneumonia. Patient's labs are reassuring.  Patient is requesting for work note.   Fayrene Helper, PA-C 08/23/15 1850  Lorre Nick, MD 08/25/15 1434

## 2015-11-27 ENCOUNTER — Encounter (HOSPITAL_COMMUNITY): Payer: Self-pay | Admitting: Emergency Medicine

## 2015-11-27 ENCOUNTER — Emergency Department (HOSPITAL_COMMUNITY)
Admission: EM | Admit: 2015-11-27 | Discharge: 2015-11-28 | Disposition: A | Discharge status: Home / Self Care | Payer: BLUE CROSS/BLUE SHIELD | Attending: Emergency Medicine | Admitting: Emergency Medicine

## 2015-11-27 DIAGNOSIS — Y9389 Activity, other specified: Secondary | ICD-10-CM | POA: Diagnosis not present

## 2015-11-27 DIAGNOSIS — Z79899 Other long term (current) drug therapy: Secondary | ICD-10-CM | POA: Diagnosis not present

## 2015-11-27 DIAGNOSIS — Z8679 Personal history of other diseases of the circulatory system: Secondary | ICD-10-CM | POA: Diagnosis not present

## 2015-11-27 DIAGNOSIS — Z8709 Personal history of other diseases of the respiratory system: Secondary | ICD-10-CM | POA: Diagnosis not present

## 2015-11-27 DIAGNOSIS — Y92410 Unspecified street and highway as the place of occurrence of the external cause: Secondary | ICD-10-CM | POA: Insufficient documentation

## 2015-11-27 DIAGNOSIS — Y998 Other external cause status: Secondary | ICD-10-CM | POA: Insufficient documentation

## 2015-11-27 DIAGNOSIS — S199XXA Unspecified injury of neck, initial encounter: Secondary | ICD-10-CM | POA: Insufficient documentation

## 2015-11-27 DIAGNOSIS — M62838 Other muscle spasm: Secondary | ICD-10-CM | POA: Diagnosis not present

## 2015-11-27 DIAGNOSIS — S0990XA Unspecified injury of head, initial encounter: Secondary | ICD-10-CM | POA: Diagnosis present

## 2015-11-27 MED ORDER — ONDANSETRON 4 MG PO TBDP
4.0000 mg | ORAL_TABLET | Freq: Once | ORAL | Status: AC
  Administered 2015-11-27: 4 mg via ORAL
  Filled 2015-11-27: qty 1

## 2015-11-27 MED ORDER — IBUPROFEN 800 MG PO TABS
800.0000 mg | ORAL_TABLET | Freq: Once | ORAL | Status: AC
  Administered 2015-11-27: 800 mg via ORAL
  Filled 2015-11-27: qty 1

## 2015-11-27 MED ORDER — IBUPROFEN 800 MG PO TABS: 800.0000 mg | ORAL_TABLET | Freq: Three times a day (TID) | ORAL | Status: DC | PRN

## 2015-11-27 MED ORDER — METHOCARBAMOL 500 MG PO TABS: 500.0000 mg | ORAL_TABLET | Freq: Three times a day (TID) | ORAL | Status: DC | PRN

## 2015-11-27 MED ORDER — METHOCARBAMOL 500 MG PO TABS
500.0000 mg | ORAL_TABLET | Freq: Once | ORAL | Status: AC
  Administered 2015-11-27: 500 mg via ORAL
  Filled 2015-11-27: qty 1
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
## 2015-11-29 ENCOUNTER — Encounter (HOSPITAL_COMMUNITY): Payer: Self-pay | Admitting: Emergency Medicine

## 2015-11-29 ENCOUNTER — Emergency Department (HOSPITAL_COMMUNITY)
Admission: EM | Admit: 2015-11-29 | Discharge: 2015-11-30 | Disposition: A | Discharge status: Home / Self Care | Payer: No Typology Code available for payment source | Attending: Emergency Medicine | Admitting: Emergency Medicine

## 2015-11-29 DIAGNOSIS — M7918 Myalgia, other site: Secondary | ICD-10-CM

## 2015-11-29 DIAGNOSIS — M542 Cervicalgia: Secondary | ICD-10-CM | POA: Diagnosis not present

## 2015-11-29 DIAGNOSIS — Y92410 Unspecified street and highway as the place of occurrence of the external cause: Secondary | ICD-10-CM | POA: Diagnosis not present

## 2015-11-29 DIAGNOSIS — M545 Low back pain: Secondary | ICD-10-CM | POA: Diagnosis not present

## 2015-11-29 DIAGNOSIS — Y999 Unspecified external cause status: Secondary | ICD-10-CM | POA: Insufficient documentation

## 2015-11-29 DIAGNOSIS — Y939 Activity, unspecified: Secondary | ICD-10-CM | POA: Insufficient documentation
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
## 2015-11-30 MED ORDER — IBUPROFEN 600 MG PO TABS: 600.0000 mg | ORAL_TABLET | Freq: Four times a day (QID) | ORAL | Status: DC | PRN

## 2015-11-30 MED ORDER — TRAMADOL HCL 50 MG PO TABS: 50.0000 mg | ORAL_TABLET | Freq: Four times a day (QID) | ORAL | Status: DC | PRN

## 2015-11-30 MED ORDER — DIAZEPAM 5 MG PO TABS: 5.0000 mg | ORAL_TABLET | Freq: Two times a day (BID) | ORAL | Status: DC

## 2015-11-30 NOTE — Discharge Instructions (Signed)
Take your medication as prescribed. I recommend continuing to apply ice to affected area for 15-20 minutes 3-4 times daily to help with pain and swelling. Please follow up with a primary care provider from the Resource Guide provided below in one to 2 weeks if your pain has not improved. Please return to the Emergency Department if symptoms worsen or new onset of headache, neck stiffness, visual changes, dizziness, fever, numbness, tingling, groin numbness, urinary retention, loss of bowel or bladder, weakness.

## 2018-06-26 ENCOUNTER — Emergency Department (HOSPITAL_COMMUNITY)
Admission: EM | Admit: 2018-06-26 | Discharge: 2018-06-26 | Disposition: A | Discharge status: Home / Self Care | Payer: 59 | Attending: Emergency Medicine | Admitting: Emergency Medicine

## 2018-06-26 ENCOUNTER — Encounter (HOSPITAL_COMMUNITY): Payer: Self-pay

## 2018-06-26 ENCOUNTER — Other Ambulatory Visit: Payer: Self-pay

## 2018-06-26 ENCOUNTER — Emergency Department (HOSPITAL_COMMUNITY): Payer: 59

## 2018-06-26 DIAGNOSIS — R05 Cough: Secondary | ICD-10-CM | POA: Diagnosis present

## 2018-06-26 DIAGNOSIS — Z79899 Other long term (current) drug therapy: Secondary | ICD-10-CM | POA: Diagnosis not present

## 2018-06-26 DIAGNOSIS — J069 Acute upper respiratory infection, unspecified: Secondary | ICD-10-CM | POA: Insufficient documentation

## 2018-06-26 DIAGNOSIS — R059 Cough, unspecified: Secondary | ICD-10-CM

## 2018-06-26 LAB — COMPREHENSIVE METABOLIC PANEL
ALBUMIN: 3.8 g/dL (ref 3.5–5.0)
ALK PHOS: 79 U/L (ref 38–126)
ALT: 17 U/L (ref 0–44)
AST: 20 U/L (ref 15–41)
Anion gap: 11 (ref 5–15)
BILIRUBIN TOTAL: 0.3 mg/dL (ref 0.3–1.2)
BUN: 9 mg/dL (ref 6–20)
CHLORIDE: 102 mmol/L (ref 98–111)
CO2: 25 mmol/L (ref 22–32)
CREATININE: 0.99 mg/dL (ref 0.61–1.24)
Calcium: 8.9 mg/dL (ref 8.9–10.3)
GFR calc Af Amer: >60 mL/min (ref >60)
GFR calc non Af Amer: >60 mL/min (ref >60)
GLUCOSE: 86 mg/dL (ref 70–99)
Potassium: 3.7 mmol/L (ref 3.5–5.1)
Sodium: 138 mmol/L (ref 135–145)
TOTAL PROTEIN: 7.1 g/dL (ref 6.5–8.1)

## 2018-06-26 LAB — CBC WITH DIFFERENTIAL/PLATELET
Abs Immature Granulocytes: 0.03 10*3/uL (ref 0.00–0.07)
Basophils Absolute: 0 10*3/uL (ref 0.0–0.1)
Basophils Relative: 0 %
EOS ABS: 0.2 10*3/uL (ref 0.0–0.5)
EOS PCT: 2 %
HCT: 43.3 % (ref 39.0–52.0)
HEMOGLOBIN: 13.9 g/dL (ref 13.0–17.0)
Immature Granulocytes: 0 %
LYMPHS ABS: 1.6 10*3/uL (ref 0.7–4.0)
LYMPHS PCT: 19 %
MCH: 26.2 pg (ref 26.0–34.0)
MCHC: 32.1 g/dL (ref 30.0–36.0)
MCV: 81.7 fL (ref 80.0–100.0)
MONOS PCT: 8 %
Monocytes Absolute: 0.7 10*3/uL (ref 0.1–1.0)
NEUTROS PCT: 71 %
Neutro Abs: 6.1 10*3/uL (ref 1.7–7.7)
Platelets: 295 10*3/uL (ref 150–400)
RBC: 5.3 MIL/uL (ref 4.22–5.81)
RDW: 14.3 % (ref 11.5–15.5)
WBC: 8.7 10*3/uL (ref 4.0–10.5)
nRBC: 0 % (ref 0.0–0.2)

## 2018-06-26 MED ORDER — BENZONATATE 100 MG PO CAPS: 200.0000 mg | ORAL_CAPSULE | Freq: Three times a day (TID) | ORAL | 0 refills | Status: DC

## 2018-06-26 MED ORDER — ALBUTEROL SULFATE HFA 108 (90 BASE) MCG/ACT IN AERS: 1.0000 | INHALATION_SPRAY | Freq: Four times a day (QID) | RESPIRATORY_TRACT | 0 refills | Status: DC | PRN

## 2018-06-26 MED ORDER — HYDROCODONE-CHLORPHENIRAMINE 5-4 MG/5ML PO SOLN: 5.0000 mL | Freq: Every evening | ORAL | 0 refills | Status: DC | PRN

## 2018-06-26 MED ORDER — HYDROCOD POLST-CPM POLST ER 10-8 MG/5ML PO SUER
5.0000 mL | Freq: Once | ORAL | Status: AC
  Administered 2018-06-26: 5 mL via ORAL
  Filled 2018-06-26: qty 5

## 2018-06-26 MED ORDER — IPRATROPIUM-ALBUTEROL 0.5-2.5 (3) MG/3ML IN SOLN
3.0000 mL | Freq: Once | RESPIRATORY_TRACT | Status: AC
  Administered 2018-06-26: 3 mL via RESPIRATORY_TRACT
  Filled 2018-06-26: qty 3

## 2018-06-26 NOTE — ED Triage Notes (Signed)
Pt reports ever since he returned from [place] last month he has had a strong productive cough, no blood. Pt reports night sweats, denies fever. Intermittent headcahes

## 2018-06-26 NOTE — ED Provider Notes (Signed)
[facility] EMERGENCY DEPARTMENT Provider Note   CSN: [id number] Arrival date & time: 06/26/18  1701     History   Chief Complaint Chief Complaint  Patient presents with  . Cough    HPI Shawn Gray is a 27 y.o. male who presents to ED for 1 month history of cough productive with mucus, night sweats and intermittent sinus pressure.  States that symptoms began after he returned from a trip to [place] with his wife.  States that his wife did not have any of the symptoms.  He has tried Alka-Seltzer, NyQuil with no improvement in his cough.  He does endorse getting bit by either mosquitoes or other insects while on the trip.  These areas have healed.  He denies fever, abdominal pain, he does endorse 2 episodes of posttussive emesis yesterday, chest pain, shortness of breath.  He has a history of asthma as a child.  HPI  Past Medical History:  Diagnosis Date  . Migraine   . Strep throat     There are no active problems to display for this patient.   Past Surgical History:  Procedure Laterality Date  . MOUTH SURGERY          Home Medications    Prior to Admission medications   Medication Sig Start Date End Date Taking? Authorizing Provider  acetaminophen (TYLENOL) 500 MG tablet Take 500 mg by mouth every 6 (six) hours as needed.    [provider]  albuterol (PROVENTIL HFA;VENTOLIN HFA) 108 (90 Base) MCG/ACT inhaler Inhale 1-2 puffs into the lungs every 6 (six) hours as needed for wheezing or shortness of breath. 06/26/18   Anasha Perfecto, PA-C  benzonatate (TESSALON) 100 MG capsule Take 2 capsules (200 mg total) by mouth every 8 (eight) hours. 06/26/18   Ewa Hipp, PA-C  diazepam (VALIUM) 5 MG tablet Take 1 tablet (5 mg total) by mouth 2 (two) times daily. 11/30/15   Barrett Henle, PA-C  HYDROcodone-Chlorpheniramine 5-4 MG/5ML SOLN Take 5 mLs by mouth at bedtime as needed. 06/26/18   Tashona Calk, PA-C  ibuprofen (ADVIL,MOTRIN) 600  MG tablet Take 1 tablet (600 mg total) by mouth every 6 (six) hours as needed. 11/30/15   Barrett Henle, PA-C  methocarbamol (ROBAXIN) 500 MG tablet Take 1 tablet (500 mg total) by mouth every 8 (eight) hours as needed for muscle spasms. 11/27/15   Ward, Layla Maw, DO  minocycline (MINOCIN,DYNACIN) 100 MG capsule Take 1 capsule by mouth 2 (two) times daily. 05/03/15   [provider]  OVER THE COUNTER MEDICATION Apply 1 application topically daily as needed (rash / dry skin). kiti kiti cream    [provider]  promethazine (PHENERGAN) 25 MG tablet Take 1 tablet (25 mg total) by mouth every 6 (six) hours as needed for nausea. 08/23/15   Fayrene Helper, PA-C  traMADol (ULTRAM) 50 MG tablet Take 1 tablet (50 mg total) by mouth every 6 (six) hours as needed. 11/30/15   Barrett Henle, PA-C  diphenhydrAMINE (BENADRYL) 25 MG tablet Take 1 tablet (25 mg total) by mouth every 6 (six) hours. Patient not taking: Reported on 08/23/2015 06/29/15 11/27/15  Rolland Porter, MD  metoCLOPramide (REGLAN) 10 MG tablet Take 1 tablet (10 mg total) by mouth every 6 (six) hours as needed for nausea (Headache. take with benadryl and Naprosyn). Patient not taking: Reported on 08/23/2015 06/29/15 11/27/15  Rolland Porter, MD    Family History Family History  Problem Relation Age of Onset  .  Hypertension Mother     Social History Social History   Tobacco Use  . Smoking status: Never Smoker  . Smokeless tobacco: Never Used  Substance Use Topics  . Alcohol use: No  . Drug use: No     Allergies   Other and Nickel   Review of Systems Review of Systems  Constitutional: Negative for appetite change, chills, diaphoresis and fever.  HENT: Positive for sinus pressure. Negative for ear pain, rhinorrhea, sneezing and sore throat.   Eyes: Negative for photophobia and visual disturbance.  Respiratory: Positive for cough. Negative for chest tightness, shortness of breath and wheezing.     Cardiovascular: Negative for chest pain and palpitations.  Gastrointestinal: Negative for abdominal pain, blood in stool, constipation, diarrhea, nausea and vomiting.  Genitourinary: Negative for dysuria, hematuria and urgency.  Musculoskeletal: Negative for myalgias.  Skin: Negative for rash.  Neurological: Negative for dizziness, weakness and light-headedness.     Physical Exam Updated Vital Signs BP (!) 155/97 (BP Location: Right Arm)   Pulse 80   Temp 98.1 F (36.7 C) Comment: Took 400mg  Ibuprofen at 10am today  Resp 18   Ht 6' (1.829 m)   Wt 132 kg   SpO2 100%   BMI 39.47 kg/m   Physical Exam Vitals signs and nursing note reviewed.  Constitutional:      General: He is not in acute distress.    Appearance: He is well-developed.     Comments: Cough noted on examination.  HENT:     Head: Normocephalic and atraumatic.     Nose: Nose normal.  Eyes:     General: No scleral icterus.       Left eye: No discharge.     Conjunctiva/sclera: Conjunctivae normal.  Neck:     Musculoskeletal: Normal range of motion and neck supple.  Cardiovascular:     Rate and Rhythm: Normal rate and regular rhythm.     Heart sounds: Normal heart sounds. No murmur. No friction rub. No gallop.   Pulmonary:     Effort: Pulmonary effort is normal. No respiratory distress.     Breath sounds: Normal breath sounds.  Abdominal:     General: Bowel sounds are normal. There is no distension.     Palpations: Abdomen is soft.     Tenderness: There is no abdominal tenderness. There is no guarding.  Musculoskeletal: Normal range of motion.  Skin:    General: Skin is warm and dry.     Findings: No rash.  Neurological:     Mental Status: He is alert.     Motor: No abnormal muscle tone.     Coordination: Coordination normal.      ED Treatments / Results  Labs (all labs ordered are listed, but only abnormal results are displayed) Labs Reviewed  CBC WITH DIFFERENTIAL/PLATELET  COMPREHENSIVE  METABOLIC PANEL  QUANTIFERON-TB GOLD PLUS    EKG None  Radiology Dg Chest 2 View  Result Date: 06/26/2018 CLINICAL DATA:  Cough EXAM: CHEST - 2 VIEW COMPARISON:  10/20/2014 FINDINGS: The heart size and mediastinal contours are within normal limits. Both lungs are clear. The visualized skeletal structures are unremarkable. IMPRESSION: No active cardiopulmonary disease. Electronically Signed   By: Jasmine Pang M.D.   On: 06/26/2018 18:41    Procedures Procedures (including critical care time)  Medications Ordered in ED Medications  chlorpheniramine-HYDROcodone (TUSSIONEX) 10-8 MG/5ML suspension 5 mL (5 mLs Oral Given 06/26/18 1750)  ipratropium-albuterol (DUONEB) 0.5-2.5 (3) MG/3ML nebulizer solution 3 mL (3 mLs Nebulization  Given 06/26/18 1750)     Initial Impression / Assessment and Plan / ED Course  I have reviewed the triage vital signs and the nursing notes.  Pertinent labs & imaging results that were available during my care of the patient were reviewed by me and considered in my medical decision making (see chart for details).     27 year old male presents to ED for 1 month history of cough productive with mucus, night sweats and intermittent sinus pressure.  Symptoms began after he returned from a trip to [place] with his wife.  Wife did not have any symptoms.  He did get bit by either mosquitoes or other insects while the trip.  No improvement with Alka-Seltzer NyQuil.  Denies any GI symptoms except for 2 episodes of posttussive emesis yesterday.  Denies any chest pain, shortness of breath.  On exam he is overall well-appearing.  There is a dry cough noted on exam.  He is not tachycardic, tachypneic or hypoxic.  Basic lab work including CBC and CMP were checked which were unremarkable.  Chest x-ray is negative.  Patient is not having any GI symptoms that would concern me for infection such as malaria or typhoid.  However, due to the duration of his respiratory symptoms and  recent travel a TB QuantiFERON gold test will be evaluated.  Patient reports significant improvement in his symptoms with the medications that were given.  Will discharge home with antitussives and inhaler.  Patient is comfortable with this plan.  I did encourage him to establish care with a primary care provider.  It is reassuring that his lab work is unremarkable as I would expect some discrepancies with any severe infection.  Nevertheless he was given strict return precautions for any severe worsening symptoms. Patient is hemodynamically stable, in NAD, and able to ambulate in the ED. Evaluation does not show pathology that would require ongoing emergent intervention or inpatient treatment. I explained the diagnosis to the patient. Pain has been managed and has no complaints prior to discharge. Patient is comfortable with above plan and is stable for discharge at this time. All questions were answered prior to disposition. Strict return precautions for returning to the ED were discussed. Encouraged follow up with PCP.    Portions of this note were generated with Scientist, clinical (histocompatibility and immunogenetics)Dragon dictation software. Dictation errors may occur despite best attempts at proofreading.   Final Clinical Impressions(s) / ED Diagnoses   Final diagnoses:  Cough  Upper respiratory tract infection, unspecified type    ED Discharge Orders         Ordered    HYDROcodone-Chlorpheniramine 5-4 MG/5ML SOLN  At bedtime PRN     06/26/18 1918    benzonatate (TESSALON) 100 MG capsule  Every 8 hours     06/26/18 1918    albuterol (PROVENTIL HFA;VENTOLIN HFA) 108 (90 Base) MCG/ACT inhaler  Every 6 hours PRN     06/26/18 1918           Dietrich PatesKhatri, Tyia Binford, PA-C 06/26/18 1923    Terrilee FilesButler, Michael C, MD 06/27/18 1052

## 2018-06-26 NOTE — Discharge Instructions (Signed)
We will contact you with the results of your QuantiFERON-TB test when it is available. Return to ED for worsening symptoms, chest pain, coughing up blood, trouble breathing or trouble swallowing, lightheadedness or loss of consciousness.

## 2018-07-03 ENCOUNTER — Ambulatory Visit (INDEPENDENT_AMBULATORY_CARE_PROVIDER_SITE_OTHER): Payer: 59 | Admitting: Nurse Practitioner

## 2018-07-03 ENCOUNTER — Encounter: Payer: Self-pay | Admitting: Nurse Practitioner

## 2018-07-03 VITALS — BP 138/98 | HR 76 | Ht 72.0 in | Wt 301.0 lb

## 2018-07-03 DIAGNOSIS — R030 Elevated blood-pressure reading, without diagnosis of hypertension: Secondary | ICD-10-CM | POA: Diagnosis not present

## 2018-07-03 DIAGNOSIS — L910 Hypertrophic scar: Secondary | ICD-10-CM | POA: Diagnosis not present

## 2018-07-03 LAB — QUANTIFERON-TB GOLD PLUS: QuantiFERON-TB Gold Plus: NEGATIVE

## 2018-07-03 LAB — QUANTIFERON-TB GOLD PLUS (RQFGPL)
QUANTIFERON MITOGEN VALUE: 9.15 [IU]/mL
QuantiFERON Nil Value: 0.2 IU/mL
QuantiFERON TB1 Ag Value: 0.2 IU/mL
QuantiFERON TB2 Ag Value: 0.2 IU/mL

## 2018-07-03 NOTE — Patient Instructions (Signed)
I have placed a referral to dermatology for you  Please try to check your blood pressure once daily or at least a few times a week, at the same time each day, and keep a log. Please return in about 3-4 weeks with your log, we can do your physical at next appointment if you would like   Hypertension Hypertension, commonly called high blood pressure, is when the force of blood pumping through the arteries is too strong. The arteries are the blood vessels that carry blood from the heart throughout the body. Hypertension forces the heart to work harder to pump blood and may cause arteries to become narrow or stiff. Having untreated or uncontrolled hypertension can cause heart attacks, strokes, kidney disease, and other problems. A blood pressure reading consists of a higher number over a lower number. Ideally, your blood pressure should be below 120/80. The first ("top") number is called the systolic pressure. It is a measure of the pressure in your arteries as your heart beats. The second ("bottom") number is called the diastolic pressure. It is a measure of the pressure in your arteries as the heart relaxes. What are the causes? The cause of this condition is not known. What increases the risk? Some risk factors for high blood pressure are under your control. Others are not. Factors you can change  Smoking.  Having type 2 diabetes mellitus, high cholesterol, or both.  Not getting enough exercise or physical activity.  Being overweight.  Having too much fat, sugar, calories, or salt (sodium) in your diet.  Drinking too much alcohol. Factors that are difficult or impossible to change  Having chronic kidney disease.  Having a family history of high blood pressure.  Age. Risk increases with age.  Race. You may be at higher risk if you are African-American.  Gender. Men are at higher risk than women before age 27. After age 27, women are at higher risk than men.  Having obstructive  sleep apnea.  Stress. What are the signs or symptoms? Extremely high blood pressure (hypertensive crisis) may cause:  Headache.  Anxiety.  Shortness of breath.  Nosebleed.  Nausea and vomiting.  Severe chest pain.  Jerky movements you cannot control (seizures). How is this diagnosed? This condition is diagnosed by measuring your blood pressure while you are seated, with your arm resting on a surface. The cuff of the blood pressure monitor will be placed directly against the skin of your upper arm at the level of your heart. It should be measured at least twice using the same arm. Certain conditions can cause a difference in blood pressure between your right and left arms. Certain factors can cause blood pressure readings to be lower or higher than normal (elevated) for a short period of time:  When your blood pressure is higher when you are in a health care provider's office than when you are at home, this is called white coat hypertension. Most people with this condition do not need medicines.  When your blood pressure is higher at home than when you are in a health care provider's office, this is called masked hypertension. Most people with this condition may need medicines to control blood pressure. If you have a high blood pressure reading during one visit or you have normal blood pressure with other risk factors:  You may be asked to return on a different day to have your blood pressure checked again.  You may be asked to monitor your blood pressure at home for  1 week or longer. If you are diagnosed with hypertension, you may have other blood or imaging tests to help your health care provider understand your overall risk for other conditions. How is this treated? This condition is treated by making healthy lifestyle changes, such as eating healthy foods, exercising more, and reducing your alcohol intake. Your health care provider may prescribe medicine if lifestyle changes are  not enough to get your blood pressure under control, and if:  Your systolic blood pressure is above 130.  Your diastolic blood pressure is above 80. Your personal target blood pressure may vary depending on your medical conditions, your age, and other factors. Follow these instructions at home: Eating and drinking   Eat a diet that is high in fiber and potassium, and low in sodium, added sugar, and fat. An example eating plan is called the DASH (Dietary Approaches to Stop Hypertension) diet. To eat this way: ? Eat plenty of fresh fruits and vegetables. Try to fill half of your plate at each meal with fruits and vegetables. ? Eat whole grains, such as whole wheat pasta, brown rice, or whole grain bread. Fill about one quarter of your plate with whole grains. ? Eat or drink low-fat dairy products, such as skim milk or low-fat yogurt. ? Avoid fatty cuts of meat, processed or cured meats, and poultry with skin. Fill about one quarter of your plate with lean proteins, such as fish, chicken without skin, beans, eggs, and tofu. ? Avoid premade and processed foods. These tend to be higher in sodium, added sugar, and fat.  Reduce your daily sodium intake. Most people with hypertension should eat less than 1,500 mg of sodium a day.  Limit alcohol intake to no more than 1 drink a day for nonpregnant women and 2 drinks a day for men. One drink equals 12 oz of beer, 5 oz of wine, or 1 oz of hard liquor. Lifestyle   Work with your health care provider to maintain a healthy body weight or to lose weight. Ask what an ideal weight is for you.  Get at least 30 minutes of exercise that causes your heart to beat faster (aerobic exercise) most days of the week. Activities may include walking, swimming, or biking.  Include exercise to strengthen your muscles (resistance exercise), such as pilates or lifting weights, as part of your weekly exercise routine. Try to do these types of exercises for 30 minutes at  least 3 days a week.  Do not use any products that contain nicotine or tobacco, such as cigarettes and e-cigarettes. If you need help quitting, ask your health care provider.  Monitor your blood pressure at home as told by your health care provider.  Keep all follow-up visits as told by your health care provider. This is important. Medicines  Take over-the-counter and prescription medicines only as told by your health care provider. Follow directions carefully. Blood pressure medicines must be taken as prescribed.  Do not skip doses of blood pressure medicine. Doing this puts you at risk for problems and can make the medicine less effective.  Ask your health care provider about side effects or reactions to medicines that you should watch for. Contact a health care provider if:  You think you are having a reaction to a medicine you are taking.  You have headaches that keep coming back (recurring).  You feel dizzy.  You have swelling in your ankles.  You have trouble with your vision. Get help right away if:  You develop a severe headache or confusion.  You have unusual weakness or numbness.  You feel faint.  You have severe pain in your chest or abdomen.  You vomit repeatedly.  You have trouble breathing. Summary  Hypertension is when the force of blood pumping through your arteries is too strong. If this condition is not controlled, it may put you at risk for serious complications.  Your personal target blood pressure may vary depending on your medical conditions, your age, and other factors. For most people, a normal blood pressure is less than 120/80.  Hypertension is treated with lifestyle changes, medicines, or a combination of both. Lifestyle changes include weight loss, eating a healthy, low-sodium diet, exercising more, and limiting alcohol. This information is not intended to replace advice given to you by your health care provider. Make sure you discuss any  questions you have with your health care provider. Document Released: 07/02/2005 Document Revised: 05/30/2016 Document Reviewed: 05/30/2016 Elsevier Interactive Patient Education  2019 [employer].

## 2018-07-03 NOTE — Progress Notes (Signed)
Shawn Gray is a 27 y.o. male with the following history as recorded in EpicCare:  There are no active problems to display for this patient.   Current Outpatient Medications  Medication Sig Dispense Refill  . acetaminophen (TYLENOL) 500 MG tablet Take 500 mg by mouth every 6 (six) hours as needed.    Marland Kitchen. albuterol (PROVENTIL HFA;VENTOLIN HFA) 108 (90 Base) MCG/ACT inhaler Inhale 1-2 puffs into the lungs every 6 (six) hours as needed for wheezing or shortness of breath. 1 Inhaler 0  . benzonatate (TESSALON) 100 MG capsule Take 2 capsules (200 mg total) by mouth every 8 (eight) hours. 21 capsule 0  . HYDROcodone-Chlorpheniramine 5-4 MG/5ML SOLN Take 5 mLs by mouth at bedtime as needed. 40 mL 0  . ibuprofen (ADVIL,MOTRIN) 600 MG tablet Take 1 tablet (600 mg total) by mouth every 6 (six) hours as needed. 30 tablet 0  . OVER THE COUNTER MEDICATION Apply 1 application topically daily as needed (rash / dry skin). kiti kiti cream    . diazepam (VALIUM) 5 MG tablet Take 1 tablet (5 mg total) by mouth 2 (two) times daily. (Patient not taking: Reported on 07/03/2018) 10 tablet 0  . methocarbamol (ROBAXIN) 500 MG tablet Take 1 tablet (500 mg total) by mouth every 8 (eight) hours as needed for muscle spasms. (Patient not taking: Reported on 07/03/2018) 20 tablet 0  . promethazine (PHENERGAN) 25 MG tablet Take 1 tablet (25 mg total) by mouth every 6 (six) hours as needed for nausea. (Patient not taking: Reported on 07/03/2018) 20 tablet 0  . traMADol (ULTRAM) 50 MG tablet Take 1 tablet (50 mg total) by mouth every 6 (six) hours as needed. (Patient not taking: Reported on 07/03/2018) 15 tablet 0   No current facility-administered medications for this visit.     Allergies: Other and Nickel  Past Medical History:  Diagnosis Date  . Migraine   . Obesity   . Strep throat     Past Surgical History:  Procedure Laterality Date  . MOUTH SURGERY      Family History  Problem Relation Age of Onset  .  Hypertension Mother   . Diabetes Father   . Heart disease Maternal Grandmother   . Cancer Maternal Grandfather   . Diabetes Paternal Grandmother     Social History   Tobacco Use  . Smoking status: Never Smoker  . Smokeless tobacco: Never Used  Substance Use Topics  . Alcohol use: Yes     Subjective:  Shawn Gray is here today to establish care as a new patient to our practice, transferring from prior provider in [place] due to move. Aside from primary care, he  is not routinely followed by any specialists, was followed by neurology for migraines in the past but this is no longer a problem for him. He is not currently maintained on any daily medications. He would like to discuss a skin problem today. We will also discuss his elevated blood pressure.   Skin problem- he c/o larger keloid scar to posterior head at base of neck, present for several years, increasing in size over time. Has seen a barber who tried to help by cutting the area but it only got worse. He has multiple smaller keloid scars around the larger one to his posterior head and under his chin/jaw as well. He was on minocycline bid for this in the past but it did not seem to help. He denies drainage, erythema, fevers, pain.  Hypertension - not maintained on BP  meds in the past Has not seen high readings when he checks at home but does not check regularly Does feel nervous at office visits he admits He can start checking bp readings at home if needed  BP Readings from Last 3 Encounters:  07/03/18 (!) 138/98  06/26/18 (!) 125/45  11/29/15 142/84    Review of Systems  Constitutional: Negative for fever.  Respiratory: Negative for shortness of breath.   Cardiovascular: Negative for chest pain and leg swelling.  Musculoskeletal: Negative for falls.  Skin: Positive for itching and rash.  Neurological: Negative for dizziness, loss of consciousness, weakness and headaches.  Endo/Heme/Allergies: Does not bruise/bleed easily.    Objective:  Vitals:   07/03/18 1047 07/03/18 1132  BP: (!) 128/98 (!) 138/98  Pulse: 76   SpO2: 97%   Weight: (!) 301 lb (136.5 kg)   Height: 6' (1.829 m)     General: Well developed, well nourished, in no acute distress  Skin : Warm and dry.  Large keloid scar to posterior neck, numerous smaller keloids to posterior neck and chin without erythema, exudate Head: Normocephalic and atraumatic  Eyes: Sclera and conjunctiva clear; pupils round and reactive to light; extraocular movements intact  Ears: External normal; canals clear; tympanic membranes normal  Oropharynx: Pink, supple. No suspicious lesions  Neck: Supple without thyromegaly, adenopathy  Lungs: Respirations unlabored; clear to auscultation bilaterally without wheeze, rales, rhonchi  CVS exam: normal rate and regular rhythm, S1 and S2 normal.  Extremities: No edema, cyanosis, clubbing  Vessels: Symmetric bilaterally  Neurologic: Alert and oriented; speech intact; face symmetrical; moves all extremities well; CNII-XII intact without focal deficit  Psychiatric: Normal mood and affect.  Assessment:  1. Elevated blood pressure reading   2. Keloid scar     Plan:  06/26/18-Cbc w diff, cmet WNL  1. Keloid scar Due to size, extent of scarring, referral to dermatology was recommended and he is agreeable - Ambulatory referral to Dermatology  2. Elevated blood pressure reading He will keep home log for 3-4 weeks and RTC for follow up, if BP remains elevated will consider starting antihypertensive therapy Home management, red flags and return precautions including when to seek immediate care discussed and printed on AVS   Return in about 1 month (around 08/03/2018) for F/U: BP, CPE.  Orders Placed This Encounter  Procedures  . Ambulatory referral to Dermatology    Referral Priority:   Routine    Referral Type:   Consultation    Referral Reason:   Specialty Services Required    Requested Specialty:   Dermatology     Number of Visits Requested:   1    Requested Prescriptions    No prescriptions requested or ordered in this encounter

## 2018-07-30 ENCOUNTER — Ambulatory Visit: Payer: 59 | Admitting: Nurse Practitioner

## 2018-08-08 ENCOUNTER — Ambulatory Visit: Payer: 59 | Admitting: Nurse Practitioner

## 2019-03-13 ENCOUNTER — Telehealth: Payer: Self-pay | Admitting: Family

## 2019-03-13 NOTE — Telephone Encounter (Signed)
Patient is requesting to establish care with you. He was a previous patient of [employer] Would you be willing to see him? (see message below)

## 2019-03-13 NOTE — Telephone Encounter (Signed)
Yes, that's fine 

## 2019-03-13 NOTE — Telephone Encounter (Signed)
Copied from [place] [id number]. Topic: General - Other >> Mar 13, 2019 11:44 AM Celene Kras A wrote: Reason for CRM: Pts wife called and is requesting to have pts care switched to Dr. Valere Dross. 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 Please advise.

## 2019-03-13 NOTE — Telephone Encounter (Signed)
Appointment scheduled.  It has been made on Tuesday so that him and his wife and come together but swap out to watch their child.

## 2019-03-17 ENCOUNTER — Ambulatory Visit (INDEPENDENT_AMBULATORY_CARE_PROVIDER_SITE_OTHER): Payer: Managed Care, Other (non HMO) | Admitting: Family

## 2019-03-17 ENCOUNTER — Other Ambulatory Visit: Payer: Self-pay

## 2019-03-17 ENCOUNTER — Encounter: Payer: Self-pay | Admitting: Family

## 2019-03-17 ENCOUNTER — Other Ambulatory Visit: Payer: Self-pay | Admitting: Family

## 2019-03-17 ENCOUNTER — Other Ambulatory Visit (INDEPENDENT_AMBULATORY_CARE_PROVIDER_SITE_OTHER): Payer: Managed Care, Other (non HMO)

## 2019-03-17 VITALS — BP 130/80 | HR 77 | Temp 98.2°F | Ht 72.0 in | Wt 321.8 lb

## 2019-03-17 DIAGNOSIS — Z0000 Encounter for general adult medical examination without abnormal findings: Secondary | ICD-10-CM

## 2019-03-17 DIAGNOSIS — Z13220 Encounter for screening for lipoid disorders: Secondary | ICD-10-CM

## 2019-03-17 LAB — LIPID PANEL
Cholesterol: 164 mg/dL (ref 0–200)
HDL: 47.5 mg/dL (ref >39.00)
LDL Cholesterol: 104 mg/dL — ABNORMAL HIGH (ref 0–99)
NonHDL: 116.89
Total CHOL/HDL Ratio: 3
Triglycerides: 66 mg/dL (ref 0.0–149.0)
VLDL: 13.2 mg/dL (ref 0.0–40.0)

## 2019-03-17 LAB — COMPREHENSIVE METABOLIC PANEL
ALT: 28 U/L (ref 0–53)
AST: 24 U/L (ref 0–37)
Albumin: 4.3 g/dL (ref 3.5–5.2)
Alkaline Phosphatase: 89 U/L (ref 39–117)
BUN: 14 mg/dL (ref 6–23)
CO2: 24 mEq/L (ref 19–32)
Calcium: 9.2 mg/dL (ref 8.4–10.5)
Chloride: 106 mEq/L (ref 96–112)
Creatinine, Ser: 1 mg/dL (ref 0.40–1.50)
GFR: 107.9 mL/min (ref >60.00)
Glucose, Bld: 92 mg/dL (ref 70–99)
Potassium: 3.9 mEq/L (ref 3.5–5.1)
Sodium: 139 mEq/L (ref 135–145)
Total Bilirubin: 0.4 mg/dL (ref 0.2–1.2)
Total Protein: 7.6 g/dL (ref 6.0–8.3)

## 2019-03-17 LAB — CBC WITH DIFFERENTIAL/PLATELET
Basophils Absolute: 0 10*3/uL (ref 0.0–0.1)
Basophils Relative: 0.5 % (ref 0.0–3.0)
Eosinophils Absolute: 0.2 10*3/uL (ref 0.0–0.7)
Eosinophils Relative: 2.5 % (ref 0.0–5.0)
HCT: 47.6 % (ref 39.0–52.0)
Hemoglobin: 15.5 g/dL (ref 13.0–17.0)
Lymphocytes Relative: 25.3 % (ref 12.0–46.0)
Lymphs Abs: 1.8 10*3/uL (ref 0.7–4.0)
MCHC: 32.6 g/dL (ref 30.0–36.0)
MCV: 82.8 fl (ref 78.0–100.0)
Monocytes Absolute: 0.4 10*3/uL (ref 0.1–1.0)
Monocytes Relative: 6.1 % (ref 3.0–12.0)
Neutro Abs: 4.5 10*3/uL (ref 1.4–7.7)
Neutrophils Relative %: 65.6 % (ref 43.0–77.0)
Platelets: 288 10*3/uL (ref 150.0–400.0)
RBC: 5.75 Mil/uL (ref 4.22–5.81)
RDW: 14.6 % (ref 11.5–15.5)
WBC: 6.9 10*3/uL (ref 4.0–10.5)

## 2019-03-17 LAB — VITAMIN D 25 HYDROXY (VIT D DEFICIENCY, FRACTURES): VITD: 27.6 ng/mL — ABNORMAL LOW (ref 30.00–100.00)

## 2019-03-17 LAB — TSH: TSH: 2.31 u[IU]/mL (ref 0.35–4.50)

## 2019-03-17 NOTE — Progress Notes (Signed)
Shawn Gray is a 28 y.o. male with the following history as recorded in EpicCare:  There are no active problems to display for this patient.   Current Outpatient Medications  Medication Sig Dispense Refill  . Multiple Vitamin (MULTIVITAMIN WITH MINERALS) TABS tablet Take 1 tablet by mouth daily.    Marland Kitchen. albuterol (PROVENTIL HFA;VENTOLIN HFA) 108 (90 Base) MCG/ACT inhaler Inhale 1-2 puffs into the lungs every 6 (six) hours as needed for wheezing or shortness of breath. (Patient not taking: Reported on 03/17/2019) 1 Inhaler 0   No current facility-administered medications for this visit.     Allergies: Other and Nickel  Past Medical History:  Diagnosis Date  . Migraine   . Obesity   . Strep throat     Past Surgical History:  Procedure Laterality Date  . MOUTH SURGERY      Family History  Problem Relation Age of Onset  . Hypertension Mother   . Diabetes Father   . Heart disease Maternal Grandmother   . Cancer Maternal Grandfather   . Diabetes Paternal Grandmother     Social History   Tobacco Use  . Smoking status: Never Smoker  . Smokeless tobacco: Never Used  Substance Use Topics  . Alcohol use: Yes    Subjective:  Presents for yearly CPE; in baseline state of health with no concerns;  Does have occasional migraines- responds well to OTC Aleve; has worked with neurology in the past but did not respond to oral medications; has been working to make changes in stress level with good improvement in symptoms;  Defers flu shot today; Overdue to see eye doctor; overdue to see dentist as well;  Weight is up this year- readily admits that needs to work on diet; planning to get back on Keto diet;  Not exercising regularly due to COVID restrictions; normally works out 4 days per week;    Review of Systems  Constitutional: Negative.   HENT: Negative.   Eyes: Negative.   Respiratory: Negative.   Cardiovascular: Negative.   Gastrointestinal: Negative.   Genitourinary: Negative.    Musculoskeletal: Negative.   Skin: Negative.   Neurological: Negative.   Endo/Heme/Allergies: Negative.   Psychiatric/Behavioral: Negative.      Objective:  Vitals:   03/17/19 0919  BP: 130/80  Pulse: 77  Temp: 98.2 F (36.8 C)  TempSrc: Oral  SpO2: 98%  Weight: (!) 321 lb 12.8 oz (146 kg)  Height: 6' (1.829 m)    General: Well developed, well nourished, in no acute distress  Skin : Warm and dry. Keloid noted on back of scalp; Head: Normocephalic and atraumatic  Eyes: Sclera and conjunctiva clear; pupils round and reactive to light; extraocular movements intact  Ears: External normal; canals clear; tympanic membranes normal  Oropharynx: Pink, supple. No suspicious lesions  Neck: Supple without thyromegaly, adenopathy  Lungs: Respirations unlabored; clear to auscultation bilaterally without wheeze, rales, rhonchi  CVS exam: normal rate and regular rhythm.  Abdomen: Soft; nontender; nondistended; normoactive bowel sounds; no masses or hepatosplenomegaly  Musculoskeletal: No deformities; no active joint inflammation  Extremities: No edema, cyanosis, clubbing  Vessels: Symmetric bilaterally  Neurologic: Alert and oriented; speech intact; face symmetrical; moves all extremities well; CNII-XII intact without focal deficit   Assessment:  1. PE (physical exam), annual   2. Class 3 severe obesity without serious comorbidity in adult, unspecified BMI, unspecified obesity type [facility](HCC)     Plan:  Age appropriate preventive healthcare needs addressed; encouraged regular eye doctor and dental exams; encouraged regular  exercise; will update labs and refills as needed today; follow-up to be determined; Discussed need for weight loss- he will consider re-starting Keto diet and/or meeting with weight loss specialist; follow-up to be determined.   No follow-ups on file.  No orders of the defined types were placed in this encounter.   Requested Prescriptions    No prescriptions requested  or ordered in this encounter

## 2019-03-17 NOTE — Patient Instructions (Signed)
Ketogenic Eating Plan, Adult A ketogenic eating plan is a diet that is very low in carbohydrates, moderately low in protein, and very high in fat. Your body normally gets energy from eating carbohydrates. If you limit carbohydrates in your diet, your body will start to use stored fat as an energy source. When fat is broken down for energy, it enters your blood as a substance called ketones. A ketogenic eating plan relies mostly on ketones for energy. This eating plan can cause rapid weight loss because the body burns fat for fuel. What are the benefits of a ketogenic eating plan? Epilepsy A ketogenic diet is sometimes used to treat epilepsy in children who have seizures that are not helped by medicines. As a treatment for epilepsy in children, ketogenic eating plans have been well studied and used for many years. This type of diet is usually started in the hospital and carefully monitored by a health care team. It is usually tried for several months to see if it will lessen seizures. Other conditions Ketogenic eating plans have also been studied to help treat other conditions, including:  Cancer.  Type 2 diabetes.  Alzheimer's disease.  Parkinson's disease.  Autism.  Multiple sclerosis. More studies need to be done to see how well this eating plan works for these and other conditions and what the long-term side effects might be. What are the side effects of a ketogenic eating plan? Side effects of a ketogenic diet may include:  Vitamin deficiencies.  Loss of body fluids (dehydration).  Bad breath.  Nausea.  Hunger.  Difficulty passing stool (constipation).  Problems with menstrual periods.  Inflammation of the pancreas.  Kidney stones or gall bladder stones.  Bone fractures.  High cholesterol. What are tips for following this plan? Reading food labels  Look for foods that have a low glycemic index (GI) label.  Read food ingredient lists to check for any hidden or  added sugar.  Check food labels for the number of grams of carbohydrates and protein. This ensures that you are eating the right amounts of these nutrients. This is important. Cooking  Carefully measure or weigh foods.  Make desserts using ketogenic or low GI recipes.  Avoid cooking with sauces that contain added sugar, such as barbecue sauce or ketchup. Meal planning  Aim for a daily meal and snack time schedule that you can follow consistently.  Every meal will include high-fat items such as avocado, cream, butter, or mayonnaise. General information   Buy a gram scale to weigh foods. This is necessary to follow this diet correctly and accurately. Your dietitian will teach you how to use one for this diet.  Ask your health care provider what steps you can take to avoid side effects of this eating plan, such as constipation or kidney stones.  Take vitamin and mineral supplements only as told by a health care provider or dietitian.  Work with your dietitian and health care provider to handle the key challenges of this diet and to help you stay on track. What foods should I eat?  Fruits Fresh pineapple, peaches, and apples. Other fresh and frozen fruits in small amounts. Vegetables Lettuce. Beets. Bok choy. Eggplant. Tomatoes. Turnips. Cucumbers. Peppers. Radishes. Cauliflower. Zucchini. Fennel. Swiss chard. Grains Whole wheat bread. Bran cereal. Brown rice. Whole wheat pasta. Low GI cereals. Meats and other proteins Meat, poultry, and fish. Eggs. Egg substitutes. Nuts, seeds, lentils, and split peas in small amounts. Bacon. Dairy Cheese in moderate amounts. Beverages Plain water. Sugar-free,   caffeine-free beverages. Mineral water or club soda. Caffeine-free, carbohydrate-free herbal tea. Fats and oils Avocado. Cream. Sour cream. Cream cheese. Butter. Plant-based oils, such as olive, canola, and sunflower. Margarine. Mayonnaise. Sweets and desserts Any homemade sweets or  desserts made using ketogenic diet recipes. The items listed above may not be a complete list of recommended foods and beverages. Contact a dietitian for more information. What foods should I avoid? Fruits Fruit juice. Fruits packed in syrups. Dried or candied fruits. Vegetables Corn. Potatoes. Peas. Grains All bread, dry cereals, and cooked cereals with added sugar. Baked goods. Crackers and pretzels. Meats and other proteins Meat, poultry, or fish prepared with flour or breading. Nut butters with added sugar. Beans. Dairy Milk. Yogurt. Fats and oils Salad dressings with added sugar. Gravies. Beverages Sugar-sweetened teas, coffee drinks, or soft drinks. Juice. Sports drinks. Sweets and desserts All sweets and desserts, unless the dessert is homemade using ketogenic diet recipes. The items listed above may not be a complete list of foods and beverages to avoid. Contact a dietitian for more information. Summary  A ketogenic eating plan is a diet that is very low in carbohydrates, moderately low in protein, and very high in fat.  Aim for a daily meal and snack time schedule that you can follow consistently.  Buy a gram scale to weigh foods. This is necessary to follow this diet correctly and accurately. Your dietitian will teach you how to use one for this diet.  Work closely with your health care provider and a dietitian while you are following a ketogenic eating plan. This information is not intended to replace advice given to you by your health care provider. Make sure you discuss any questions you have with your health care provider. Document Released: 11/07/2017 Document Revised: 11/07/2017 Document Reviewed: 11/07/2017 Elsevier Patient Education  2020 Elsevier Inc.  

## 2019-09-24 ENCOUNTER — Ambulatory Visit: Payer: Managed Care, Other (non HMO) | Attending: Internal Medicine

## 2019-12-09 ENCOUNTER — Ambulatory Visit (INDEPENDENT_AMBULATORY_CARE_PROVIDER_SITE_OTHER): Payer: 59

## 2019-12-09 ENCOUNTER — Encounter (HOSPITAL_COMMUNITY): Payer: Self-pay

## 2019-12-09 ENCOUNTER — Other Ambulatory Visit: Payer: Self-pay

## 2019-12-09 ENCOUNTER — Ambulatory Visit (HOSPITAL_COMMUNITY)
Admission: EM | Admit: 2019-12-09 | Discharge: 2019-12-09 | Disposition: A | Discharge status: Home / Self Care | Payer: 59 | Attending: Family Medicine | Admitting: Family Medicine

## 2019-12-09 DIAGNOSIS — R079 Chest pain, unspecified: Secondary | ICD-10-CM | POA: Diagnosis not present

## 2019-12-09 DIAGNOSIS — R030 Elevated blood-pressure reading, without diagnosis of hypertension: Secondary | ICD-10-CM

## 2019-12-09 MED ORDER — DICLOFENAC SODIUM 75 MG PO TBEC: 75.0000 mg | DELAYED_RELEASE_TABLET | Freq: Two times a day (BID) | ORAL | 0 refills | Status: DC

## 2019-12-09 NOTE — ED Provider Notes (Signed)
[facility]   [id number] 12/09/19 Arrival Time: 7062  ASSESSMENT & PLAN:  1. Chest pain, unspecified type   2. Elevated blood pressure reading without diagnosis of hypertension     Patient history and exam consistent with non-cardiac cause of chest pain. Conservative measures indicated. Discussed likely MSK etiology.  ECG: Performed today and interpreted by me: normal EKG, normal sinus rhythm; no STEMI  I have personally viewed the imaging studies ordered this visit. Normal CXR.  No indication for hospital evaluation.  Begin trial of: Meds ordered this encounter  Medications  . diclofenac (VOLTAREN) 75 MG EC tablet    Sig: Take 1 tablet (75 mg total) by mouth 2 (two) times daily.    Dispense:  14 tablet    Refill:  0      Discharge Instructions     You have been seen at the [facility] today for chest pain. Your evaluation today was not suggestive of any emergent condition requiring medical intervention at this time. Your ECG (heart tracing) did not show any worrisome changes. However, some medical problems make take more time to appear. Therefore, it's very important that you pay attention to any new symptoms or worsening of your current condition.  Please proceed directly to the Emergency Department immediately should you feel worse in any way or have any of the following symptoms: increasing or different chest pain, pain that spreads to your arm, neck, jaw, back or abdomen, shortness of breath, or nausea and vomiting.  Your blood pressure was noted to be elevated during your visit today. If you are currently taking medication for high blood pressure, please ensure you are taking this as directed. If you do not have a history of high blood pressure and your blood pressure remains persistently elevated, you may need to begin taking a medication at some point. You may return here within the next few days to recheck if unable to see your primary care  provider or if do not have a one.  BP (!) 161/101   Pulse 71   Temp 98.3 F (36.8 C) (Oral)   Resp 18   Ht 6' (1.829 m)   Wt (!) 143.3 kg   SpO2 100%   BMI 42.86 kg/m       No orders of the defined types were placed in this encounter.   Chest pain precautions given. Reviewed expectations re: course of current medical issues. Questions answered. Outlined signs and symptoms indicating need for more acute intervention. Patient verbalized understanding. After Visit Summary given.   SUBJECTIVE:  History from: patient. Shawn Gray is a 29 y.o. male who presents with complaint of failry persistent anterior chest pain described as "a tension"; initial pain was sharp in nature; started L upper chest and radiated to L chest; "now just the tension feeling". Without back pain. Onset abrupt, first noted approx 3 hours ago while seated at desk. Reports these symptoms are stable since beginning. Reports intensity is mile; without associated n/v; without associated SOB. Injury or recent strenuous activity: none. Denies: irregular heart beat, lower extremity edema, near-syncope, orthopnea, palpitations, paroxysmal nocturnal dyspnea and syncope. Aggravating factors: have not been identified. Alleviating factors: have not been identified. Not worse with movements at waist or of upper extremities. Recent illnesses: none. Fever: absent. Ambulatory without assistance. Self/OTC treatment: none. History of similar: no. Illicit drug use: none.  Social History   Tobacco Use  Smoking Status Never Smoker  Smokeless Tobacco Never Used   Social History  Substance and Sexual Activity  Alcohol Use Yes   Comment: occ    Increased blood pressure noted today. Reports that he has not been treated for hypertension in the past.  He reports no swelling of ankles, no orthostatic dizziness or lightheadedness, no orthopnea or paroxysmal nocturnal dyspnea and no  palpitations.   OBJECTIVE:  Vitals:   12/09/19 1112 12/09/19 1115  BP: (!) 161/101   Pulse: 71   Resp: 18   Temp: 98.3 F (36.8 C)   TempSrc: Oral   SpO2: 100%   Weight:  (!) 143.3 kg  Height:  6' (1.829 m)    General appearance: alert, oriented, no acute distress Eyes: PERRLA; EOMI; conjunctivae normal HENT: normocephalic; atraumatic Neck: supple with FROM Lungs: without labored respirations; speaks full sentences without difficulty; lung sounds are distant but I do hear air movement bilaterally Heart: regular Chest Wall: without specific tenderness to palpation; "maybe a little tender if you press on it" Abdomen: obese; soft Extremities: without edema; without calf swelling or tenderness; symmetrical without gross deformities Skin: warm and dry; without rash or lesions Neuro: normal gait Psychological: alert and cooperative; normal mood and affect   Imaging: DG Chest 2 View  Result Date: 12/09/2019 CLINICAL DATA:  Chest pain EXAM: CHEST - 2 VIEW COMPARISON:  06/26/2018 FINDINGS: The heart size and mediastinal contours are within normal limits. Both lungs are clear. The visualized skeletal structures are unremarkable. IMPRESSION: No active cardiopulmonary disease. Electronically Signed   By: Alcide Clever M.D.   On: 12/09/2019 14:37     Allergies  Allergen Reactions  . Other Anaphylaxis    Tree nuts  . Nickel Rash    Topical allergy causes burning    Past Medical History:  Diagnosis Date  . Migraine   . Obesity   . Strep throat    Social History   Socioeconomic History  . Marital status: Married    Spouse name: Not on file  . Number of children: Not on file  . Years of education: Not on file  . Highest education level: Not on file  Occupational History  . Not on file  Tobacco Use  . Smoking status: Never Smoker  . Smokeless tobacco: Never Used  Substance and Sexual Activity  . Alcohol use: Yes    Comment: occ  . Drug use: No  . Sexual activity: Not  on file  Other Topics Concern  . Not on file  Social History Narrative  . Not on file   Social Determinants of Health   Financial Resource Strain:   . Difficulty of Paying Living Expenses:   Food Insecurity:   . Worried About Programme researcher, broadcasting/film/video in the Last Year:   . Barista in the Last Year:   Transportation Needs:   . Freight forwarder (Medical):   Marland Kitchen Lack of Transportation (Non-Medical):   Physical Activity:   . Days of Exercise per Week:   . Minutes of Exercise per Session:   Stress:   . Feeling of Stress :   Social Connections:   . Frequency of Communication with Friends and Family:   . Frequency of Social Gatherings with Friends and Family:   . Attends Religious Services:   . Active Member of Clubs or Organizations:   . Attends Banker Meetings:   Marland Kitchen Marital Status:   Intimate Partner Violence:   . Fear of Current or Ex-Partner:   . Emotionally Abused:   Marland Kitchen Physically Abused:   .  Sexually Abused:    Family History  Problem Relation Age of Onset  . Hypertension Mother   . Diabetes Father   . Heart disease Maternal Grandmother   . Cancer Maternal Grandfather   . Diabetes Paternal Grandmother    Past Surgical History:  Procedure Laterality Date  . MOUTH SURGERY       Mardella Layman, MD 12/10/19 [id number]

## 2019-12-09 NOTE — ED Triage Notes (Signed)
Pt c/o 7/10 sharp pain in chest that started on left sided chest radiated to right side of chest and down right arm. Pt has non labored breathing. Skin color WNL.

## 2019-12-09 NOTE — Discharge Instructions (Signed)
You have been seen at the [facility] today for chest pain. Your evaluation today was not suggestive of any emergent condition requiring medical intervention at this time. Your ECG (heart tracing) did not show any worrisome changes. However, some medical problems make take more time to appear. Therefore, it's very important that you pay attention to any new symptoms or worsening of your current condition.  Please proceed directly to the Emergency Department immediately should you feel worse in any way or have any of the following symptoms: increasing or different chest pain, pain that spreads to your arm, neck, jaw, back or abdomen, shortness of breath, or nausea and vomiting.  Your blood pressure was noted to be elevated during your visit today. If you are currently taking medication for high blood pressure, please ensure you are taking this as directed. If you do not have a history of high blood pressure and your blood pressure remains persistently elevated, you may need to begin taking a medication at some point. You may return here within the next few days to recheck if unable to see your primary care provider or if do not have a one.  BP (!) 161/101   Pulse 71   Temp 98.3 F (36.8 C) (Oral)   Resp 18   Ht 6' (1.829 m)   Wt (!) 143.3 kg   SpO2 100%   BMI 42.86 kg/m

## 2020-09-28 ENCOUNTER — Ambulatory Visit (INDEPENDENT_AMBULATORY_CARE_PROVIDER_SITE_OTHER): Payer: BC Managed Care – PPO | Admitting: Family

## 2020-09-28 ENCOUNTER — Encounter: Payer: Self-pay | Admitting: Family

## 2020-09-28 ENCOUNTER — Ambulatory Visit (INDEPENDENT_AMBULATORY_CARE_PROVIDER_SITE_OTHER): Payer: BC Managed Care – PPO

## 2020-09-28 ENCOUNTER — Other Ambulatory Visit: Payer: Self-pay

## 2020-09-28 VITALS — BP 160/90 | HR 86 | Temp 98.0°F | Ht 72.0 in | Wt 311.0 lb

## 2020-09-28 DIAGNOSIS — M545 Low back pain, unspecified: Secondary | ICD-10-CM

## 2020-09-28 DIAGNOSIS — R030 Elevated blood-pressure reading, without diagnosis of hypertension: Secondary | ICD-10-CM | POA: Diagnosis not present

## 2020-09-28 MED ORDER — MELOXICAM 15 MG PO TABS
15.0000 mg | ORAL_TABLET | Freq: Every day | ORAL | 0 refills | Status: DC
Start: 2020-09-28 — End: 2020-10-21

## 2020-09-28 MED ORDER — CYCLOBENZAPRINE HCL 10 MG PO TABS
10.0000 mg | ORAL_TABLET | Freq: Three times a day (TID) | ORAL | 0 refills | Status: DC | PRN
Start: 2020-09-28 — End: 2022-05-15

## 2020-09-28 NOTE — Progress Notes (Signed)
Shawn Gray is a 30 y.o. male with the following history as recorded in EpicCare:  There are no problems to display for this patient.   Current Outpatient Medications  Medication Sig Dispense Refill  . cyclobenzaprine (FLEXERIL) 10 MG tablet Take 1 tablet (10 mg total) by mouth 3 (three) times daily as needed for muscle spasms. 30 tablet 0  . meloxicam (MOBIC) 15 MG tablet Take 1 tablet (15 mg total) by mouth daily. 30 tablet 0  . Multiple Vitamin (MULTIVITAMIN WITH MINERALS) TABS tablet Take 1 tablet by mouth daily.     No current facility-administered medications for this visit.    Allergies: Other and Nickel  Past Medical History:  Diagnosis Date  . Migraine   . Obesity   . Strep throat     Past Surgical History:  Procedure Laterality Date  . MOUTH SURGERY      Family History  Problem Relation Age of Onset  . Hypertension Mother   . Diabetes Father   . Heart disease Maternal Grandmother   . Cancer Maternal Grandfather   . Diabetes Paternal Grandmother     Social History   Tobacco Use  . Smoking status: Never Smoker  . Smokeless tobacco: Never Used  Substance Use Topics  . Alcohol use: Yes    Comment: occ    Subjective:  Low back pain x 3 weeks; seemed to start after bending down to put Gatorade in refrigerator-immediately felt back spasm; no relief with OTC medication; no radiating symptoms into lower extremities; no change in bowel or bladder habits;  Known history of scoliosis;  Is working on weight loss goals- has lost 10 pounds since last OV in August 2020; is surprised to see his blood pressure elevated today; notes he checked it in February and was normal; no previous diagnosis of hypertension;      Objective:  Vitals:   09/28/20 1230  BP: (!) 160/90  Pulse: 86  Temp: 98 F (36.7 C)  TempSrc: Oral  SpO2: 98%  Weight: (!) 311 lb (141.1 kg)  Height: 6' (1.829 m)    General: Well developed, well nourished, in no acute distress  Skin : Warm and dry.   Head: Normocephalic and atraumatic  Eyes: Sclera and conjunctiva clear; pupils round and reactive to light; extraocular movements intact  Lungs: Respirations unlabored;  Musculoskeletal: No deformities; no active joint inflammation  Extremities: No edema, cyanosis, clubbing  Vessels: Symmetric bilaterally  Neurologic: Alert and oriented; speech intact; face symmetrical; moves all extremities well; CNII-XII intact without focal deficit   Assessment:  1. Acute bilateral low back pain without sciatica   2. Elevated blood pressure reading     Plan:  1. Due to length of time symptoms have persisted and severity of symptoms, will update lumbar X-ray today; trail of Mobic and Flexeril; may need to refer to PT; discussed use of prednisone but patient was concerned about weight gain; 2. ? If pressure is elevated today due to pain; it was elevated at U/C visit in 11/2019; he is to start checking his blood pressure regularly and follow up if consistently above 140/ 90; congratulated on weight loss goals- this will help with blood pressure as well;  This visit occurred during the SARS-CoV-2 public health emergency.  Safety protocols were in place, including screening questions prior to the visit, additional usage of staff PPE, and extensive cleaning of exam room while observing appropriate contact time as indicated for disinfecting solutions.     No follow-ups on file.  Orders Placed This Encounter  Procedures  . DG Lumbar Spine Complete    Standing Status:   Future    Number of Occurrences:   1    Standing Expiration Date:   09/28/2021    Order Specific Question:   Reason for Exam (SYMPTOM  OR DIAGNOSIS REQUIRED)    Answer:   low back pain    Order Specific Question:   Preferred imaging location?    Answer:   [facility]    Requested Prescriptions   Signed Prescriptions Disp Refills  . meloxicam (MOBIC) 15 MG tablet 30 tablet 0    Sig: Take 1 tablet (15 mg total) by mouth daily.  .  cyclobenzaprine (FLEXERIL) 10 MG tablet 30 tablet 0    Sig: Take 1 tablet (10 mg total) by mouth 3 (three) times daily as needed for muscle spasms.

## 2020-10-11 ENCOUNTER — Encounter: Payer: Self-pay | Admitting: Family

## 2020-10-14 NOTE — Progress Notes (Signed)
[name] [address] [place] [address] Phone: [phone] Subjective:   Shawn Gray, am serving as a scribe for Dr. Antoine Primas. This visit occurred during the SARS-CoV-2 public health emergency.  Safety protocols were in place, including screening questions prior to the visit, additional usage of staff PPE, and extensive cleaning of exam room while observing appropriate contact time as indicated for disinfecting solutions.   I'm seeing this patient by the request  of:  Olive Bass, FNP  CC: Low back pain  ION:[id number]  Shawn Gray is a 30 y.o. male coming in with complaint of lower back pain for 4 weeks. Patient bent over to put drinks in fridge and felt back spasm. Patient was seen by PCP who was then referred to [place]. Lumbar xray (-). States that he has pain throughout entire spine from cervical spine to lumbar spine. Pain in lower back began in February. Pain is burning in nature. Pain occasionally radiates down into the legs but is unsure if this symptom has been occurring.   Also feels pressure underneath both scapula. Was given mm relaxer and meloxicam which are not helping to reduce his pain.       Past Medical History:  Diagnosis Date  . Migraine   . Obesity   . Strep throat    Past Surgical History:  Procedure Laterality Date  . MOUTH SURGERY     Social History   Socioeconomic History  . Marital status: Married    Spouse name: Not on file  . Number of children: Not on file  . Years of education: Not on file  . Highest education level: Not on file  Occupational History  . Not on file  Tobacco Use  . Smoking status: Never Smoker  . Smokeless tobacco: Never Used  Vaping Use  . Vaping Use: Never used  Substance and Sexual Activity  . Alcohol use: Yes    Comment: occ  . Drug use: No  . Sexual activity: Not on file  Other Topics Concern  . Not on file  Social History Narrative  . Not on file    Social Determinants of Health   Financial Resource Strain: Not on file  Food Insecurity: Not on file  Transportation Needs: Not on file  Physical Activity: Not on file  Stress: Not on file  Social Connections: Not on file   Allergies  Allergen Reactions  . Other Anaphylaxis    Tree nuts  . Nickel Rash    Topical allergy causes burning   Family History  Problem Relation Age of Onset  . Hypertension Mother   . Diabetes Father   . Heart disease Maternal Grandmother   . Cancer Maternal Grandfather   . Diabetes Paternal Grandmother        Current Outpatient Medications (Analgesics):  .  meloxicam (MOBIC) 15 MG tablet, Take 1 tablet (15 mg total) by mouth daily.   Current Outpatient Medications (Other):  .  cyclobenzaprine (FLEXERIL) 10 MG tablet, Take 1 tablet (10 mg total) by mouth 3 (three) times daily as needed for muscle spasms. .  Multiple Vitamin (MULTIVITAMIN WITH MINERALS) TABS tablet, Take 1 tablet by mouth daily.   Reviewed prior external information including notes and imaging from  primary care provider As well as notes that were available from care everywhere and other healthcare systems.  Past medical history, social, surgical and family history all reviewed in electronic medical record.  No pertanent information unless stated regarding  to the chief complaint.   Review of Systems:  No headache, visual changes, nausea, vomiting, diarrhea, constipation, dizziness, abdominal pain, skin rash, fevers, chills, night sweats, weight loss, swollen lymph nodes, body aches, joint swelling, chest pain, shortness of breath, mood changes. POSITIVE muscle aches  Objective  Blood pressure (!) 142/112, pulse 79, height 6' (1.829 m), weight (!) 321 lb (145.6 kg), SpO2 99 %.   General: No apparent distress alert and oriented x3 mood and affect normal, dressed appropriately.  HEENT: Pupils equal, extraocular movements intact  Respiratory: Patient's speak in full sentences  and does not appear short of breath  Cardiovascular: No lower extremity edema, non tender, no erythema  Gait normal with good balance and coordination.  MSK: Low back exam does have some loss of lordosis.  Tenderness over the left sacroiliac joint.  Positive Faber on the left.  Patient does have poor core strength and weakness of the hip abductors bilaterally left greater than right.  Neurovascularly intact distally.  Osteopathic findings T4 extended rotated and side bent right  L2 flexed rotated and side bent left Sacrum left on left  [id number]; 15 additional minutes spent for Therapeutic exercises as stated in above notes.  This included exercises focusing on stretching, strengthening, with significant focus on eccentric aspects.   Long term goals include an improvement in range of motion, strength, endurance as well as avoiding reinjury. Patient's frequency would include in 1-2 times a day, 3-5 times a week for a duration of 6-12 weeks. Low back exercises that included:  Pelvic tilt/bracing instruction to focus on control of the pelvic girdle and lower abdominal muscles  Glute strengthening exercises, focusing on proper firing of the glutes without engaging the low back muscles Proper stretching techniques for maximum relief for the hamstrings, hip flexors, low back and some rotation where tolerated    Proper technique shown and discussed handout in great detail with ATC.  All questions were discussed and answered.     Impression and Recommendations:     The above documentation has been reviewed and is accurate and complete Judi Saa, DO

## 2020-10-17 ENCOUNTER — Encounter: Payer: Self-pay | Admitting: Family Medicine

## 2020-10-17 ENCOUNTER — Other Ambulatory Visit: Payer: Self-pay

## 2020-10-17 ENCOUNTER — Ambulatory Visit (INDEPENDENT_AMBULATORY_CARE_PROVIDER_SITE_OTHER): Payer: BC Managed Care – PPO | Admitting: Family Medicine

## 2020-10-17 ENCOUNTER — Ambulatory Visit (INDEPENDENT_AMBULATORY_CARE_PROVIDER_SITE_OTHER): Payer: BC Managed Care – PPO

## 2020-10-17 VITALS — BP 142/112 | HR 79 | Ht 72.0 in | Wt 321.0 lb

## 2020-10-17 DIAGNOSIS — M545 Low back pain, unspecified: Secondary | ICD-10-CM | POA: Insufficient documentation

## 2020-10-17 DIAGNOSIS — M25552 Pain in left hip: Secondary | ICD-10-CM | POA: Diagnosis not present

## 2020-10-17 DIAGNOSIS — M999 Biomechanical lesion, unspecified: Secondary | ICD-10-CM | POA: Diagnosis not present

## 2020-10-17 DIAGNOSIS — G8929 Other chronic pain: Secondary | ICD-10-CM | POA: Diagnosis not present

## 2020-10-17 NOTE — Assessment & Plan Note (Addendum)
   Decision today to treat with OMT was based on Physical Exam  After verbal consent patient was treated with HVLA, ME, FPR techniques in  thoracic, lumbar and sacral areas, Patient tolerated the procedure well with improvement in symptoms  Patient given exercises, stretches and lifestyle modifications  See medications in patient instructions if given  Patient will follow up in 4-8 weeks 

## 2020-10-17 NOTE — Patient Instructions (Addendum)
Exercises Adjustable standing desk L hip xray today See me in 4-5 weeks

## 2020-10-17 NOTE — Assessment & Plan Note (Signed)
Patient is having low back pain.  Seems to be more on the left side.  X-rays were taken previously that were completely unremarkable.  We will get a left hip x-ray.  Patient did respond extremely well to osteopathic manipulation.  I believe patient will do well.  Can continue the Flexeril and meloxicam if necessary.  Discussed weight loss and given a note for an adjustable standing desk.  Follow-up with me again 4 to 6 weeks

## 2020-10-21 ENCOUNTER — Other Ambulatory Visit: Payer: Self-pay | Admitting: Family

## 2020-11-16 ENCOUNTER — Ambulatory Visit: Payer: BC Managed Care – PPO | Admitting: Family Medicine

## 2020-11-18 NOTE — Progress Notes (Signed)
[name] [address] [place] [address] Phone: [phone] Subjective:   Shawn Gray, am serving as a scribe for Dr. Antoine Primas. This visit occurred during the SARS-CoV-2 public health emergency.  Safety protocols were in place, including screening questions prior to the visit, additional usage of staff PPE, and extensive cleaning of exam room while observing appropriate contact time as indicated for disinfecting solutions.   I'm seeing this patient by the request  of:  Olive Bass, FNP  CC: Low back pain follow-up  [id number]  Shawn Gray is a 30 y.o. male coming in with complaint of back and neck pain. OMT 10/17/2020. Patient states that he is doing much better. Lower back on left side will bother him intermittently.           Reviewed prior external information including notes and imaging from previsou exam, outside providers and external EMR if available.   As well as notes that were available from care everywhere and other healthcare systems.  Past medical history, social, surgical and family history all reviewed in electronic medical record.  No pertanent information unless stated regarding to the chief complaint.   Past Medical History:  Diagnosis Date  . Migraine   . Obesity   . Strep throat     Allergies  Allergen Reactions  . Other Anaphylaxis    Tree nuts  . Nickel Rash    Topical allergy causes burning     Review of Systems:  No headache, visual changes, nausea, vomiting, diarrhea, constipation, dizziness, abdominal pain, skin rash, fevers, chills, night sweats, weight loss, swollen lymph nodes, body aches, joint swelling, chest pain, shortness of breath, mood changes. POSITIVE muscle aches  Objective  Blood pressure 138/82, pulse 83, height 6' (1.829 m), SpO2 98 %.   General: No apparent distress alert and oriented x3 mood and affect normal, dressed appropriately.  Overweight HEENT: Pupils  equal, extraocular movements intact  Respiratory: Patient's speak in full sentences and does not appear short of breath  Cardiovascular: No lower extremity edema, non tender, no erythema  Low back exam still has loss of lordosis.  Still has poor core strength.  Tender to palpation more in the paraspinal musculature left greater than right around L4 and right greater than left around S1.  Tightness of the FABER test noted.  Negative straight leg test.  Osteopathic findings  T6 extended rotated and side bent left L4 flexed rotated and side bent left  Sacrum right on right       Assessment and Plan:  Low back pain Low back does have significant loss of lordosis.  Focus on core strength..  Patientmaking progress.  Discussed with patient on posture and ergonomics, discussed avoiding certain activities.  Encourage weight loss.  Follow-up with me again in 6 to 8 weeks.    Nonallopathic problems  Decision today to treat with OMT was based on Physical Exam  After verbal consent patient was treated with HVLA, ME, FPR techniques in  thoracic, lumbar, and sacral  areas  Patient tolerated the procedure well with improvement in symptoms  Patient given exercises, stretches and lifestyle modifications  See medications in patient instructions if given  Patient will follow up in 4-8 weeks      The above documentation has been reviewed and is accurate and complete Judi Saa, DO       Note: This dictation was prepared with Dragon dictation along with smaller phrase technology. Any transcriptional errors that  result from this process are unintentional.

## 2020-11-21 ENCOUNTER — Encounter: Payer: Self-pay | Admitting: Family Medicine

## 2020-11-21 ENCOUNTER — Ambulatory Visit (INDEPENDENT_AMBULATORY_CARE_PROVIDER_SITE_OTHER): Payer: BC Managed Care – PPO | Admitting: Family Medicine

## 2020-11-21 ENCOUNTER — Other Ambulatory Visit: Payer: Self-pay

## 2020-11-21 VITALS — BP 138/82 | HR 83 | Ht 72.0 in

## 2020-11-21 DIAGNOSIS — G8929 Other chronic pain: Secondary | ICD-10-CM

## 2020-11-21 DIAGNOSIS — M9902 Segmental and somatic dysfunction of thoracic region: Secondary | ICD-10-CM

## 2020-11-21 DIAGNOSIS — M9904 Segmental and somatic dysfunction of sacral region: Secondary | ICD-10-CM

## 2020-11-21 DIAGNOSIS — M9903 Segmental and somatic dysfunction of lumbar region: Secondary | ICD-10-CM | POA: Diagnosis not present

## 2020-11-21 DIAGNOSIS — M545 Low back pain, unspecified: Secondary | ICD-10-CM

## 2020-11-21 NOTE — Assessment & Plan Note (Addendum)
Low back does have significant loss of lordosis.  Focus on core strength..  Patientmaking progress.  Discussed with patient on posture and ergonomics, discussed avoiding certain activities.  Encourage weight loss.  Follow-up with me again in 6 to 8 weeks.

## 2020-11-21 NOTE — Patient Instructions (Signed)
Can do exercises up to 5x a week See me in 6-8 weeks

## 2020-12-21 ENCOUNTER — Telehealth: Payer: BC Managed Care – PPO

## 2021-01-05 NOTE — Progress Notes (Deleted)
  [name] [address] [place] [address] Phone: [phone] Subjective:    I'm seeing this patient by the request  of:  Olive Bass, FNP  CC: Low back pain follow-up  [id number]  Shawn Gray is a 30 y.o. male coming in with complaint of back pain. OMT 11/21/2020. Patient states   Medications patient has been prescribed: None  Taking:         Reviewed prior external information including notes and imaging from previsou exam, outside providers and external EMR if available.   As well as notes that were available from care everywhere and other healthcare systems.  Past medical history, social, surgical and family history all reviewed in electronic medical record.  No pertanent information unless stated regarding to the chief complaint.   Past Medical History:  Diagnosis Date   Migraine    Obesity    Strep throat     Allergies  Allergen Reactions   Other Anaphylaxis    Tree nuts   Nickel Rash    Topical allergy causes burning     Review of Systems:  No headache, visual changes, nausea, vomiting, diarrhea, constipation, dizziness, abdominal pain, skin rash, fevers, chills, night sweats, weight loss, swollen lymph nodes, body aches, joint swelling, chest pain, shortness of breath, mood changes. POSITIVE muscle aches  Objective  There were no vitals taken for this visit.   General: No apparent distress alert and oriented x3 mood and affect normal, dressed appropriately.  HEENT: Pupils equal, extraocular movements intact  Respiratory: Patient's speak in full sentences and does not appear short of breath  Cardiovascular: No lower extremity edema, non tender, no erythema  Low back exam shows continued loss of lordosis.  Still has poor core strength.  Continue to have tenderness to palpation left greater than right more medial.  As well as the left side of the sacroiliac joint.  Tightness noted with Pearlean Brownie bilaterally.   Negative straight leg test.  Osteopathic findings  T7 extended rotated and side bent left L2 flexed rotated and side bent right L4 flexed rotated and side bent right Sacrum left on left       Assessment and Plan:  No problem-specific Assessment & Plan notes found for this encounter.   Nonallopathic problems  Decision today to treat with OMT was based on Physical Exam  After verbal consent patient was treated with HVLA, ME, FPR techniques in  thoracic, lumbar, and sacral  areas  Patient tolerated the procedure well with improvement in symptoms  Patient given exercises, stretches and lifestyle modifications  See medications in patient instructions if given  Patient will follow up in 4-8 weeks      The above documentation has been reviewed and is accurate and complete Judi Saa, DO       Note: This dictation was prepared with Dragon dictation along with smaller phrase technology. Any transcriptional errors that result from this process are unintentional.

## 2021-01-06 ENCOUNTER — Ambulatory Visit: Payer: BC Managed Care – PPO | Admitting: Family Medicine

## 2021-01-06 NOTE — Progress Notes (Signed)
[name] [address] [place] [address] Phone: [phone] Subjective:   Shawn Gray, am serving as a scribe for Dr. Antoine Primas. This visit occurred during the SARS-CoV-2 public health emergency.  Safety protocols were in place, including screening questions prior to the visit, additional usage of staff PPE, and extensive cleaning of exam room while observing appropriate contact time as indicated for disinfecting solutions.   I'm seeing this patient by the request  of:  Olive Bass, FNP  CC: neck and back pain f/u   [id number]  Shawn Gray is a 30 y.o. male coming in with complaint of back and neck pain. OMT 11/21/2020.Patient states that he has been doing well since last visit until last night when he woke up with his back in spasm. Pain at coccyx with radiating pain up into the lower back. Had to get into warm bath last night. Unsure if pain is radiating into the legs.   Medications patient has been prescribed: None          Reviewed prior external information including notes and imaging from previsou exam, outside providers and external EMR if available.   As well as notes that were available from care everywhere and other healthcare systems.  Past medical history, social, surgical and family history all reviewed in electronic medical record.  No pertanent information unless stated regarding to the chief complaint.   Past Medical History:  Diagnosis Date   Migraine    Obesity    Strep throat     Allergies  Allergen Reactions   Other Anaphylaxis    Tree nuts   Nickel Rash    Topical allergy causes burning     Review of Systems:  No headache, visual changes, nausea, vomiting, diarrhea, constipation, dizziness, abdominal pain, skin rash, fevers, chills, night sweats, weight loss, swollen lymph nodes, joint swelling, chest pain, shortness of breath, mood changes. POSITIVE muscle aches, body aches  Objective   Blood pressure (!) 132/96, pulse 82, height 6' (1.829 m), weight (!) 313 lb (142 kg), SpO2 98 %.   General: No apparent distress alert and oriented x3 mood and affect normal, dressed appropriately.  HEENT: Pupils equal, extraocular movements intact  Respiratory: Patient's speak in full sentences and does not appear short of breath  Cardiovascular: No lower extremity edema, non tender, no erythema   Gait normal with good balance and coordination.  MSK:    Osteopathic findings   T3 extended rotated and side bent right inhaled rib T9 extended rotated and side bent left L2 flexed rotated and side bent right Sacrum right on right  Low back exam does have some loss of lordosis.  Some tenderness to palpation of the paraspinal musculature.  Patient has significant tightness in the lower back compared to previous exams.  Patient still has poor core strength noted.    Assessment and Plan:  Low back pain Low back pain.  Seems to be in acute exacerbation.  Patient does have the Flexeril and encouraged her to take it nightly for the next 3 nights.  Discussed icing regimen and home exercises.  Discussed core strengthening.  Discussed if any worsening symptoms to come back.  Secondary to exacerbation patient given injection of Toradol and Depo-Medrol today.  Warned not to take any other anti-inflammatory.  Follow-up with me again in 6 weeks   Nonallopathic problems  Decision today to treat with OMT was based on Physical Exam  After verbal consent patient was treated  with HVLA, ME, FPR techniques in thoracic, lumbar, and sacral  areas  Patient tolerated the procedure well with improvement in symptoms  Patient given exercises, stretches and lifestyle modifications  See medications in patient instructions if given  Patient will follow up in 4-8 weeks     The above documentation has been reviewed and is accurate and complete Judi Saa, DO        Note: This dictation was prepared  with Dragon dictation along with smaller phrase technology. Any transcriptional errors that result from this process are unintentional.

## 2021-01-09 ENCOUNTER — Ambulatory Visit (INDEPENDENT_AMBULATORY_CARE_PROVIDER_SITE_OTHER): Payer: BC Managed Care – PPO | Admitting: Family Medicine

## 2021-01-09 ENCOUNTER — Encounter: Payer: Self-pay | Admitting: Family Medicine

## 2021-01-09 ENCOUNTER — Other Ambulatory Visit: Payer: Self-pay

## 2021-01-09 VITALS — BP 132/96 | HR 82 | Ht 72.0 in | Wt 313.0 lb

## 2021-01-09 DIAGNOSIS — G8929 Other chronic pain: Secondary | ICD-10-CM | POA: Diagnosis not present

## 2021-01-09 DIAGNOSIS — M999 Biomechanical lesion, unspecified: Secondary | ICD-10-CM

## 2021-01-09 DIAGNOSIS — M9903 Segmental and somatic dysfunction of lumbar region: Secondary | ICD-10-CM | POA: Diagnosis not present

## 2021-01-09 DIAGNOSIS — M9902 Segmental and somatic dysfunction of thoracic region: Secondary | ICD-10-CM | POA: Diagnosis not present

## 2021-01-09 DIAGNOSIS — M9904 Segmental and somatic dysfunction of sacral region: Secondary | ICD-10-CM

## 2021-01-09 DIAGNOSIS — M545 Low back pain, unspecified: Secondary | ICD-10-CM

## 2021-01-09 MED ORDER — KETOROLAC TROMETHAMINE 60 MG/2ML IM SOLN
60.0000 mg | Freq: Once | INTRAMUSCULAR | Status: AC
  Administered 2021-01-09: 60 mg via INTRAMUSCULAR

## 2021-01-09 MED ORDER — METHYLPREDNISOLONE ACETATE 80 MG/ML IJ SUSP
80.0000 mg | Freq: Once | INTRAMUSCULAR | Status: AC
  Administered 2021-01-09: 80 mg via INTRAMUSCULAR

## 2021-01-09 NOTE — Patient Instructions (Signed)
Injections in backside Take zanaflex for next 3 nights Continue exercises Ice See me again in 5 weeks

## 2021-01-09 NOTE — Assessment & Plan Note (Signed)
Low back pain.  Seems to be in acute exacerbation.  Patient does have the Flexeril and encouraged her to take it nightly for the next 3 nights.  Discussed icing regimen and home exercises.  Discussed core strengthening.  Discussed if any worsening symptoms to come back.  Secondary to exacerbation patient given injection of Toradol and Depo-Medrol today.  Warned not to take any other anti-inflammatory.  Follow-up with me again in 6 weeks

## 2021-02-08 ENCOUNTER — Ambulatory Visit: Payer: BC Managed Care – PPO | Admitting: Family Medicine

## 2021-02-24 ENCOUNTER — Other Ambulatory Visit: Payer: Self-pay

## 2021-02-24 ENCOUNTER — Ambulatory Visit (INDEPENDENT_AMBULATORY_CARE_PROVIDER_SITE_OTHER): Payer: BC Managed Care – PPO | Admitting: Family

## 2021-02-24 ENCOUNTER — Encounter: Payer: Self-pay | Admitting: Family

## 2021-02-24 VITALS — BP 148/80 | HR 85 | Temp 97.9°F | Ht 72.0 in | Wt 320.4 lb

## 2021-02-24 DIAGNOSIS — Z0000 Encounter for general adult medical examination without abnormal findings: Secondary | ICD-10-CM | POA: Diagnosis not present

## 2021-02-24 DIAGNOSIS — Z23 Encounter for immunization: Secondary | ICD-10-CM | POA: Diagnosis not present

## 2021-02-24 DIAGNOSIS — Z13220 Encounter for screening for lipoid disorders: Secondary | ICD-10-CM

## 2021-02-24 LAB — CBC WITH DIFFERENTIAL/PLATELET
Basophils Absolute: 0 10*3/uL (ref 0.0–0.1)
Basophils Relative: 0.6 % (ref 0.0–3.0)
Eosinophils Absolute: 0.1 10*3/uL (ref 0.0–0.7)
Eosinophils Relative: 1.6 % (ref 0.0–5.0)
HCT: 45.3 % (ref 39.0–52.0)
Hemoglobin: 14.9 g/dL (ref 13.0–17.0)
Lymphocytes Relative: 23.5 % (ref 12.0–46.0)
Lymphs Abs: 1.6 10*3/uL (ref 0.7–4.0)
MCHC: 32.8 g/dL (ref 30.0–36.0)
MCV: 84.6 fl (ref 78.0–100.0)
Monocytes Absolute: 0.4 10*3/uL (ref 0.1–1.0)
Monocytes Relative: 5.7 % (ref 3.0–12.0)
Neutro Abs: 4.7 10*3/uL (ref 1.4–7.7)
Neutrophils Relative %: 68.6 % (ref 43.0–77.0)
Platelets: 288 10*3/uL (ref 150.0–400.0)
RBC: 5.36 Mil/uL (ref 4.22–5.81)
RDW: 15.4 % (ref 11.5–15.5)
WBC: 6.9 10*3/uL (ref 4.0–10.5)

## 2021-02-24 LAB — COMPREHENSIVE METABOLIC PANEL
ALT: 33 U/L (ref 0–53)
AST: 26 U/L (ref 0–37)
Albumin: 4.4 g/dL (ref 3.5–5.2)
Alkaline Phosphatase: 79 U/L (ref 39–117)
BUN: 13 mg/dL (ref 6–23)
CO2: 27 mEq/L (ref 19–32)
Calcium: 9.8 mg/dL (ref 8.4–10.5)
Chloride: 102 mEq/L (ref 96–112)
Creatinine, Ser: 1.05 mg/dL (ref 0.40–1.50)
GFR: 95.84 mL/min (ref >60.00)
Glucose, Bld: 87 mg/dL (ref 70–99)
Potassium: 4.5 mEq/L (ref 3.5–5.1)
Sodium: 139 mEq/L (ref 135–145)
Total Bilirubin: 0.6 mg/dL (ref 0.2–1.2)
Total Protein: 7.4 g/dL (ref 6.0–8.3)

## 2021-02-24 LAB — LIPID PANEL
Cholesterol: 154 mg/dL (ref 0–200)
HDL: 47.2 mg/dL (ref >39.00)
LDL Cholesterol: 91 mg/dL (ref 0–99)
NonHDL: 106.42
Total CHOL/HDL Ratio: 3
Triglycerides: 78 mg/dL (ref 0.0–149.0)
VLDL: 15.6 mg/dL (ref 0.0–40.0)

## 2021-02-24 LAB — TSH: TSH: 2.35 u[IU]/mL (ref 0.35–5.50)

## 2021-02-24 NOTE — Progress Notes (Signed)
Shawn Gray is a 30 y.o. male with the following history as recorded in [employer]:  Patient Active Problem List   Diagnosis Date Noted   Low back pain 10/17/2020   Nonallopathic lesion of sacral region 10/17/2020   Nonallopathic lesion of thoracic region 10/17/2020   Nonallopathic lesion of lumbar region 10/17/2020    Current Outpatient Medications  Medication Sig Dispense Refill   cyclobenzaprine (FLEXERIL) 10 MG tablet Take 1 tablet (10 mg total) by mouth 3 (three) times daily as needed for muscle spasms. 30 tablet 0   Multiple Vitamin (MULTIVITAMIN WITH MINERALS) TABS tablet Take 1 tablet by mouth daily.     meloxicam (MOBIC) 15 MG tablet TAKE 1 TABLET (15 MG TOTAL) BY MOUTH DAILY. (Patient not taking: Reported on 02/24/2021) 30 tablet 0   No current facility-administered medications for this visit.    Allergies: Other and Nickel  Past Medical History:  Diagnosis Date   Migraine    Obesity    Strep throat     Past Surgical History:  Procedure Laterality Date   MOUTH SURGERY      Family History  Problem Relation Age of Onset   Hypertension Mother    Diabetes Father    Heart disease Maternal Grandmother    Cancer Maternal Grandfather    Diabetes Paternal Grandmother     Social History   Tobacco Use   Smoking status: Never   Smokeless tobacco: Never  Substance Use Topics   Alcohol use: Yes    Comment: occ    Subjective:  Presents for yearly CPE;  Notes he does check his blood pressure regularly at home- averaging 130/82 at home but always elevated at MD/ dentist offices; Denies any chest pain, shortness of breath, blurred vision or headache Is working on weight loss- recent increase increase in weight thought to be related to steroid shot; working with Physiological scientist;   Review of Systems  Constitutional: Negative.   HENT: Negative.    Eyes: Negative.   Respiratory: Negative.    Cardiovascular: Negative.   Gastrointestinal: Negative.   Genitourinary:  Negative.   Musculoskeletal:  Positive for back pain.  Skin: Negative.   Neurological: Negative.   Endo/Heme/Allergies: Negative.   Psychiatric/Behavioral: Negative.          Objective:  Vitals:   02/24/21 0830  BP: (!) 148/80  Pulse: 85  Temp: 97.9 F (36.6 C)  TempSrc: Oral  SpO2: 97%  Weight: (!) 320 lb 6.4 oz (145.3 kg)  Height: 6' (1.829 m)    General: Well developed, well nourished, in no acute distress  Skin : Warm and dry.  Head: Normocephalic and atraumatic  Eyes: Sclera and conjunctiva clear; pupils round and reactive to light; extraocular movements intact  Ears: External normal; canals clear; tympanic membranes normal  Oropharynx: Pink, supple. No suspicious lesions  Neck: Supple without thyromegaly, adenopathy  Lungs: Respirations unlabored; clear to auscultation bilaterally without wheeze, rales, rhonchi  CVS exam: normal rate and regular rhythm.  Abdomen: Soft; nontender; nondistended; normoactive bowel sounds; no masses or hepatosplenomegaly  Musculoskeletal: No deformities; no active joint inflammation  Extremities: No edema, cyanosis, clubbing  Vessels: Symmetric bilaterally  Neurologic: Alert and oriented; speech intact; face symmetrical; moves all extremities well; CNII-XII intact without focal deficit   Assessment:  1. PE (physical exam), annual   2. Lipid screening   3. Need for Tdap vaccination     Plan:  Age appropriate preventive healthcare needs addressed; encouraged regular eye doctor and dental exams; encouraged regular  exercise and continue working on weight loss goals; will update labs and refills as needed today; follow-up to be determined; Update Tdap today;   This visit occurred during the SARS-CoV-2 public health emergency.  Safety protocols were in place, including screening questions prior to the visit, additional usage of staff PPE, and extensive cleaning of exam room while observing appropriate contact time as indicated for  disinfecting solutions.    No follow-ups on file.  Orders Placed This Encounter  Procedures   Tdap vaccine greater than or equal to 7yo IM   CBC with Differential/Platelet   Comp Met (CMET)   Lipid panel   TSH    Requested Prescriptions    No prescriptions requested or ordered in this encounter

## 2021-02-27 NOTE — Progress Notes (Signed)
  [name] [address] [place] [address] Phone: [phone] Subjective:   Shawn Gray, am serving as a scribe for Dr. Antoine Primas.  I'm seeing this patient by the request  of:  Olive Bass, FNP  CC: Low back pain  [id number]  Shawn Gray is a 30 y.o. male coming in with complaint of back and neck pain. OMT 01/09/2021. Patient states that he is for the most part doing alright, but he does have some tightness in his lower back from having his 30 year old in his bed with his feet in his back.   Medications patient has been prescribed: None          Past Medical History:  Diagnosis Date   Migraine    Obesity    Strep throat     Allergies  Allergen Reactions   Other Anaphylaxis    Tree nuts   Nickel Rash    Topical allergy causes burning     Review of Systems:  No headache, visual changes, nausea, vomiting, diarrhea, constipation, dizziness, abdominal pain, skin rash, fevers, chills, night sweats, weight loss, swollen lymph nodes, body aches, joint swelling, chest pain, shortness of breath, mood changes. POSITIVE muscle aches  Objective  Blood pressure 138/82, pulse 74, height 6' (1.829 m), weight (!) 318 lb (144.2 kg), SpO2 97 %.   General: No apparent distress alert and oriented x3 mood and affect normal, dressed appropriately.  HEENT: Pupils equal, extraocular movements intact  Respiratory: Patient's speak in full sentences and does not appear short of breath  Cardiovascular: No lower extremity edema, non tender, no erythema  Low back exam does have some mild loss of lordosis.  Some tightness noted in the paraspinal musculature mostly around the sacroiliac joint.  Patient does have tenderness of the FABER test bilaterally.  Poor core strength still noted.  Mild tightness noted in the right parascapular region noted today which is new.  Osteopathic findings  T5 extended rotated and side bent right inhaled  rib L2 flexed rotated and side bent right Sacrum right on right       Assessment and Plan:  Low back pain Low back exam does have some mild loss of lordosis.  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  Discussed which activities to do which wants to avoid.  Patient still has intermittent flares.  We will follow-up with me again 6 weeks  Patient does have Flexeril for breakthrough pain.  We discussed meloxicam as well. Nonallopathic problems  Decision today to treat with OMT was based on Physical Exam  After verbal consent patient was treated with HVLA, ME, FPR techniques in  thoracic, lumbar, and sacral  areas  Patient tolerated the procedure well with improvement in symptoms  Patient given exercises, stretches and lifestyle modifications  See medications in patient instructions if given  Patient will follow up in 4-8 weeks      The above documentation has been reviewed and is accurate and complete Judi Saa, DO        Note: This dictation was prepared with Dragon dictation along with smaller phrase technology. Any transcriptional errors that result from this process are unintentional.

## 2021-02-28 ENCOUNTER — Other Ambulatory Visit: Payer: Self-pay

## 2021-02-28 ENCOUNTER — Ambulatory Visit (INDEPENDENT_AMBULATORY_CARE_PROVIDER_SITE_OTHER): Payer: BC Managed Care – PPO | Admitting: Family Medicine

## 2021-02-28 ENCOUNTER — Encounter: Payer: Self-pay | Admitting: Family Medicine

## 2021-02-28 VITALS — BP 138/82 | HR 74 | Ht 72.0 in | Wt 318.0 lb

## 2021-02-28 DIAGNOSIS — G8929 Other chronic pain: Secondary | ICD-10-CM

## 2021-02-28 DIAGNOSIS — M545 Low back pain, unspecified: Secondary | ICD-10-CM

## 2021-02-28 DIAGNOSIS — M9904 Segmental and somatic dysfunction of sacral region: Secondary | ICD-10-CM

## 2021-02-28 DIAGNOSIS — M9902 Segmental and somatic dysfunction of thoracic region: Secondary | ICD-10-CM

## 2021-02-28 DIAGNOSIS — M9903 Segmental and somatic dysfunction of lumbar region: Secondary | ICD-10-CM | POA: Diagnosis not present

## 2021-02-28 NOTE — Patient Instructions (Addendum)
Good to see you  Keep working with your training  Get that baby out of your bed See me again in 6 weeks

## 2021-02-28 NOTE — Assessment & Plan Note (Signed)
Low back exam does have some mild loss of lordosis.  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  Discussed which activities to do which wants to avoid.  Patient still has intermittent flares.  We will follow-up with me again 6 weeks

## 2021-03-07 ENCOUNTER — Telehealth: Payer: Self-pay

## 2021-03-07 NOTE — Telephone Encounter (Signed)
Received forms from pt wife regarding completing paperwork so they can adopt. I have completed the Tb questions and given the form to provider to complete.

## 2021-03-07 NOTE — Telephone Encounter (Signed)
I have received the completed form and mailed it to pt home and faxed a copy to [phone]

## 2021-04-10 NOTE — Progress Notes (Signed)
[name] [address] [place] [address] Phone: [phone] Subjective:   [name], am serving as a scribe for Dr. Antoine Primas.  This visit occurred during the SARS-CoV-2 public health emergency.  Safety protocols were in place, including screening questions prior to the visit, additional usage of staff PPE, and extensive cleaning of exam room while observing appropriate contact time as indicated for disinfecting solutions.   I'm seeing this patient by the request  of:  Olive Bass, FNP  CC: Ankle pain, Follow-up on low back pain  [id number]  Shawn Gray is a 30 y.o. male coming in with complaint of back and neck pain. OMT 02/28/2021. Patient states that his back is doing well.   Patient is c/o R ankle swelling. Pain is radiating up shin. Starts over lateral malleolus. No true injury to this area.  Patient has had this from time to time over the course of years.  Medications patient has been prescribed: None  Taking:         Past Medical History:  Diagnosis Date   Migraine    Obesity    Strep throat     Allergies  Allergen Reactions   Other Anaphylaxis    Tree nuts   Nickel Rash    Topical allergy causes burning     Review of Systems:  No headache, visual changes, nausea, vomiting, diarrhea, constipation, dizziness, abdominal pain, skin rash, fevers, chills, night sweats, weight loss, swollen lymph nodes, body aches, joint swelling, chest pain, shortness of breath, mood changes. POSITIVE muscle aches  Objective  Blood pressure 118/86, pulse 73, height 6' (1.829 m), weight (!) 316 lb (143.3 kg), SpO2 98 %.   General: No apparent distress alert and oriented x3 mood and affect normal, dressed appropriately.  HEENT: Pupils equal, extraocular movements intact  Respiratory: Patient's speak in full sentences and does not appear short of breath  Cardiovascular: No lower extremity edema, non tender, no  erythema  Exam does have loss of lordosis.  Still has some poor core strength.  Patient does have limited range of motion of the back with sidebending and rotation.  Tightness with FABER test, negative straight leg test.  Foot exam shows the patient does have significant pes planus bilaterally with overpronation of the hindfoot.  Right greater than left.  Patient does have some laxity noted of the ATFL.  In addition to this patient does have tightness noted of the peroneal tendons on the right greater than left.  Osteopathic findings  T6 extended rotated and side bent left L2 flexed rotated and side bent right L5 Flexed rotated and side bent left Sacrum right on right       Assessment and Plan:  Peroneal tendinitis of lower leg, right Patient does have a peroneal tendinitis noted.  Patient does have pes planus with overpronation of the hindfoot.  I do believe the patient does have some mild ankle instability and lateral column overload that is likely contributing to excessive discomfort and swelling on the lateral aspect of the ankle.  Discussed with patient about proper shoes, recovery sandals, icing regimen.  Patient will try these different changes and follow-up with me again in 6 weeks.  Worsening pain will consider formal physical therapy and custom orthotics  Low back pain Low back exam does have some loss of lordosis still.  Patient is still working on core strength.  Encourage patient to continue to work on weight loss.  Discussed icing regimen and  home exercises.  Responded extremely well to osteopathic manipulation.  Follow-up with me in 10 4 to 6 weeks   Nonallopathic problems  Decision today to treat with OMT was based on Physical Exam  After verbal consent patient was treated with HVLA, ME, FPR techniques in  thoracic, lumbar, and sacral  areas  Patient tolerated the procedure well with improvement in symptoms  Patient given exercises, stretches and lifestyle  modifications  See medications in patient instructions if given  Patient will follow up in 4-8 weeks      The above documentation has been reviewed and is accurate and complete Judi Saa, DO        Note: This dictation was prepared with Dragon dictation along with smaller phrase technology. Any transcriptional errors that result from this process are unintentional.

## 2021-04-11 ENCOUNTER — Other Ambulatory Visit: Payer: Self-pay

## 2021-04-11 ENCOUNTER — Ambulatory Visit (INDEPENDENT_AMBULATORY_CARE_PROVIDER_SITE_OTHER): Payer: BC Managed Care – PPO | Admitting: Family Medicine

## 2021-04-11 VITALS — BP 118/86 | HR 73 | Ht 72.0 in | Wt 316.0 lb

## 2021-04-11 DIAGNOSIS — M9903 Segmental and somatic dysfunction of lumbar region: Secondary | ICD-10-CM | POA: Diagnosis not present

## 2021-04-11 DIAGNOSIS — M7671 Peroneal tendinitis, right leg: Secondary | ICD-10-CM | POA: Insufficient documentation

## 2021-04-11 DIAGNOSIS — M545 Low back pain, unspecified: Secondary | ICD-10-CM

## 2021-04-11 DIAGNOSIS — M9904 Segmental and somatic dysfunction of sacral region: Secondary | ICD-10-CM | POA: Diagnosis not present

## 2021-04-11 DIAGNOSIS — M9902 Segmental and somatic dysfunction of thoracic region: Secondary | ICD-10-CM

## 2021-04-11 DIAGNOSIS — G8929 Other chronic pain: Secondary | ICD-10-CM

## 2021-04-11 NOTE — Assessment & Plan Note (Signed)
Low back exam does have some loss of lordosis still.  Patient is still working on core strength.  Encourage patient to continue to work on weight loss.  Discussed icing regimen and home exercises.  Responded extremely well to osteopathic manipulation.  Follow-up with me in 10 4 to 6 weeks

## 2021-04-11 NOTE — Patient Instructions (Signed)
[employer] Total Support Orthotics Exercises 3x a week OOFOS or HOKA Recovery Sandals in the house See me in 7-8 weeks

## 2021-04-11 NOTE — Assessment & Plan Note (Signed)
Patient does have a peroneal tendinitis noted.  Patient does have pes planus with overpronation of the hindfoot.  I do believe the patient does have some mild ankle instability and lateral column overload that is likely contributing to excessive discomfort and swelling on the lateral aspect of the ankle.  Discussed with patient about proper shoes, recovery sandals, icing regimen.  Patient will try these different changes and follow-up with me again in 6 weeks.  Worsening pain will consider formal physical therapy and custom orthotics

## 2021-06-05 ENCOUNTER — Ambulatory Visit: Payer: BC Managed Care – PPO | Admitting: Family Medicine

## 2022-01-02 IMAGING — DX DG LUMBAR SPINE COMPLETE 4+V
5 series · 5 of 5 positions shown · non-contrast
Comparison: None.

CLINICAL DATA: Low back pain for 3 weeks.

EXAM:
LUMBAR SPINE - COMPLETE 4+ VIEW

[l-spine ap]
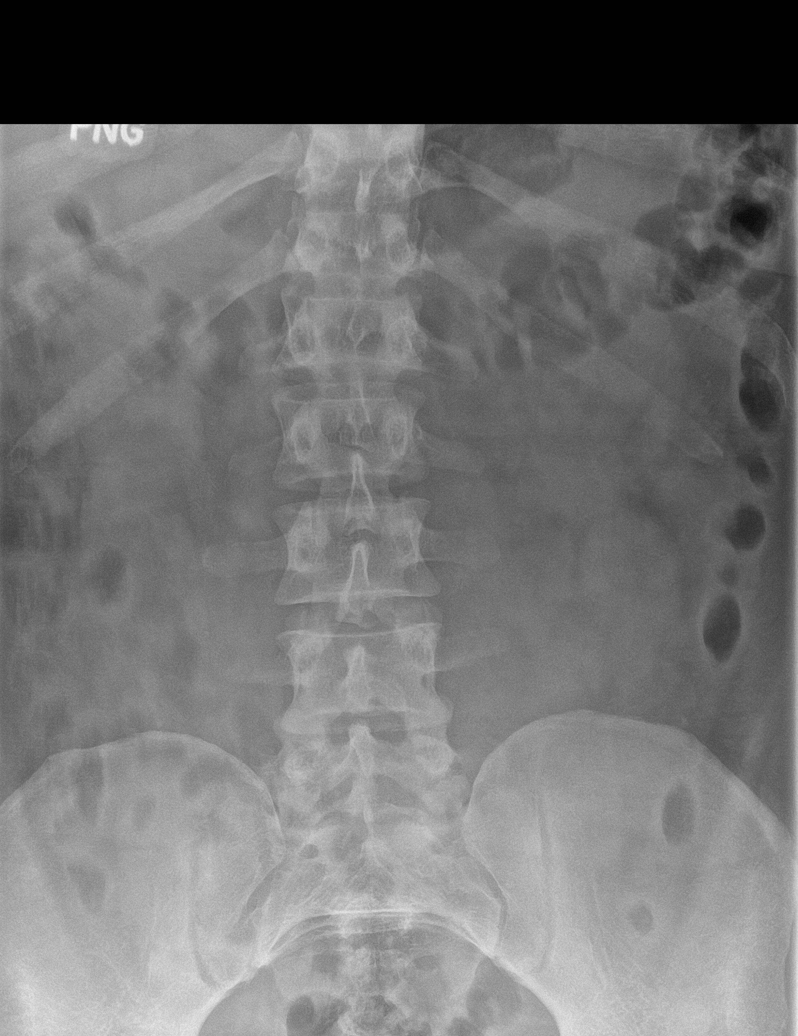

[l-spine obl (1 of 2)]
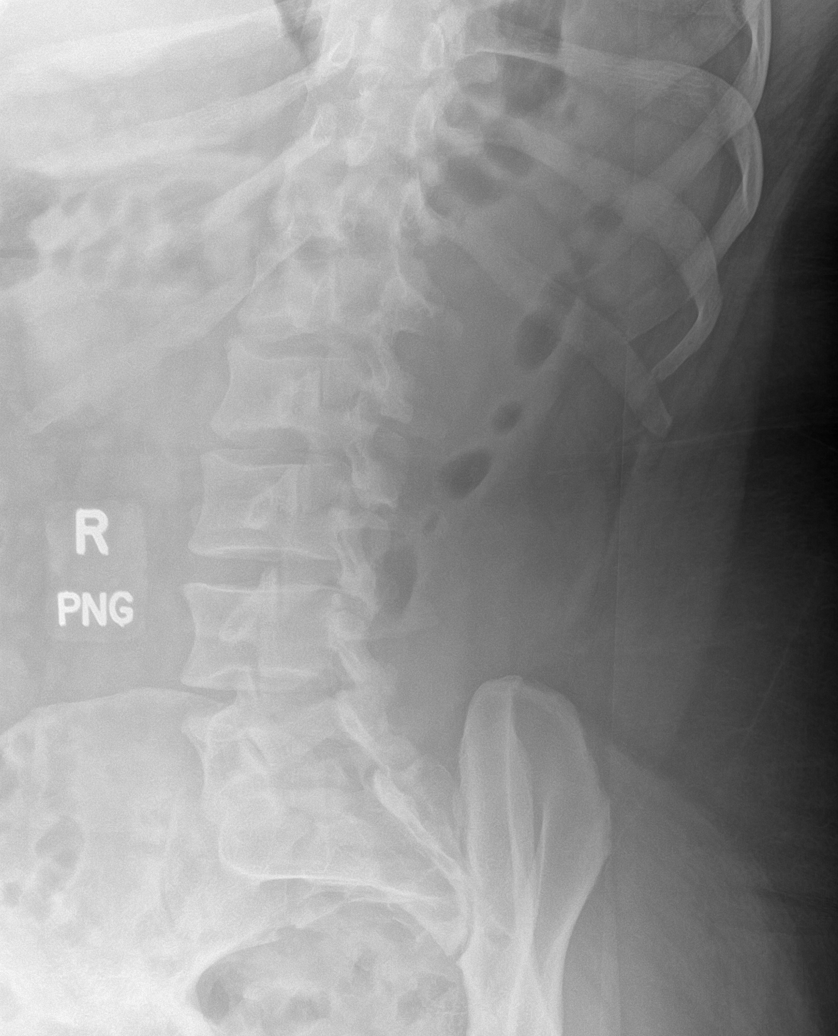

[l-spine obl (2 of 2)]
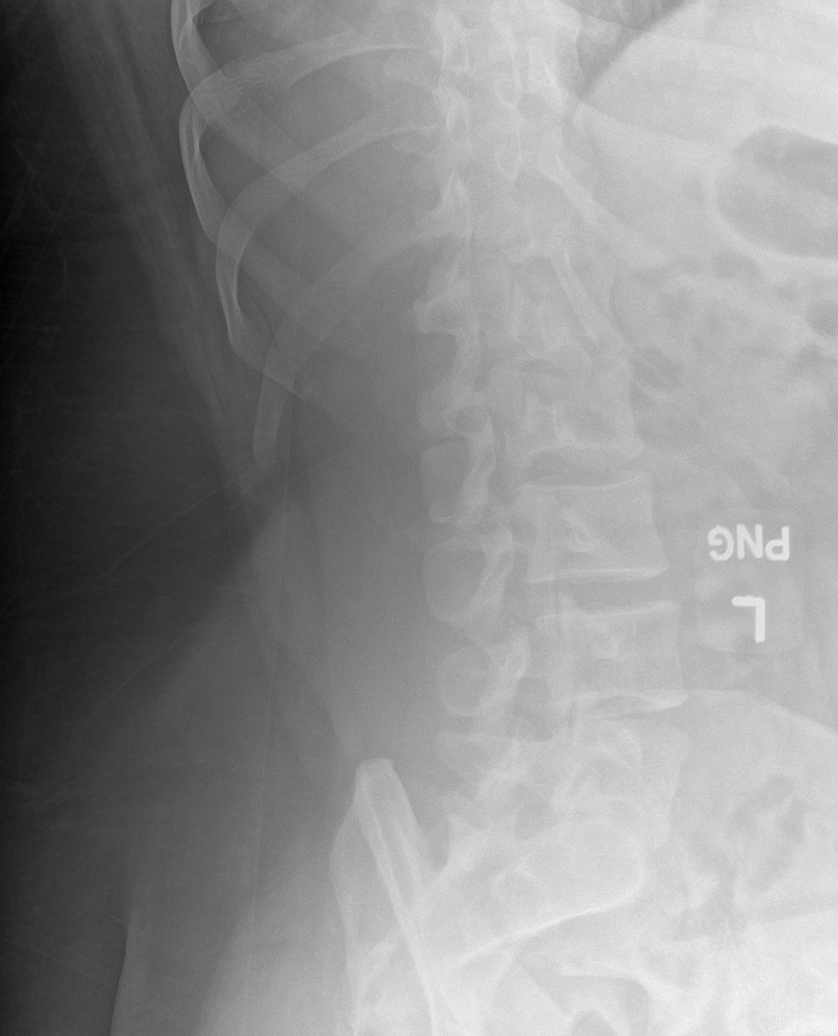

[l-spine lateral]
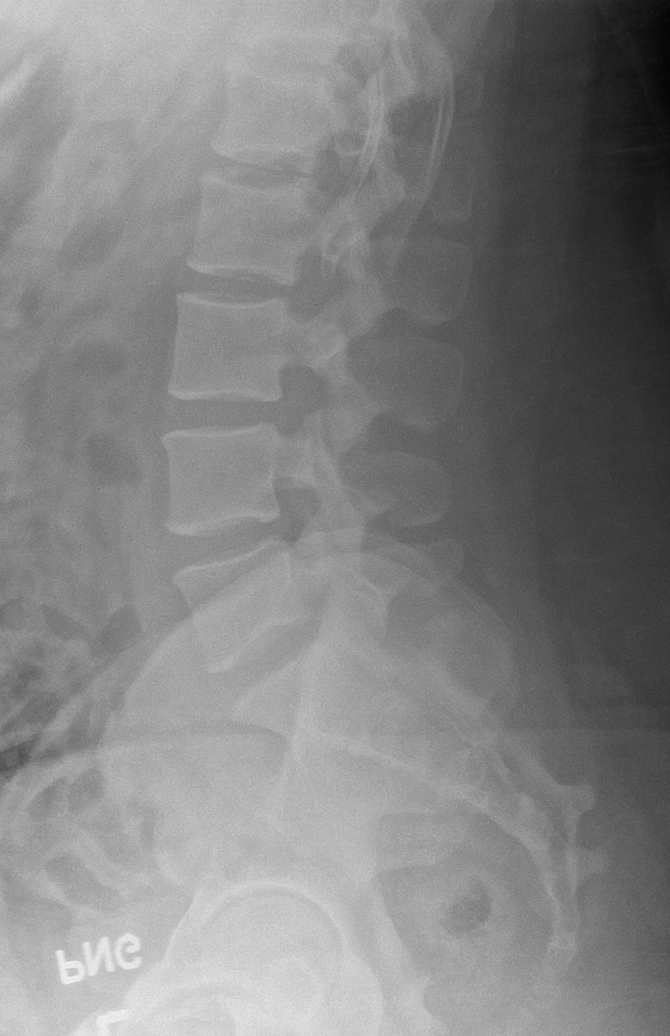

[l-spine spot]
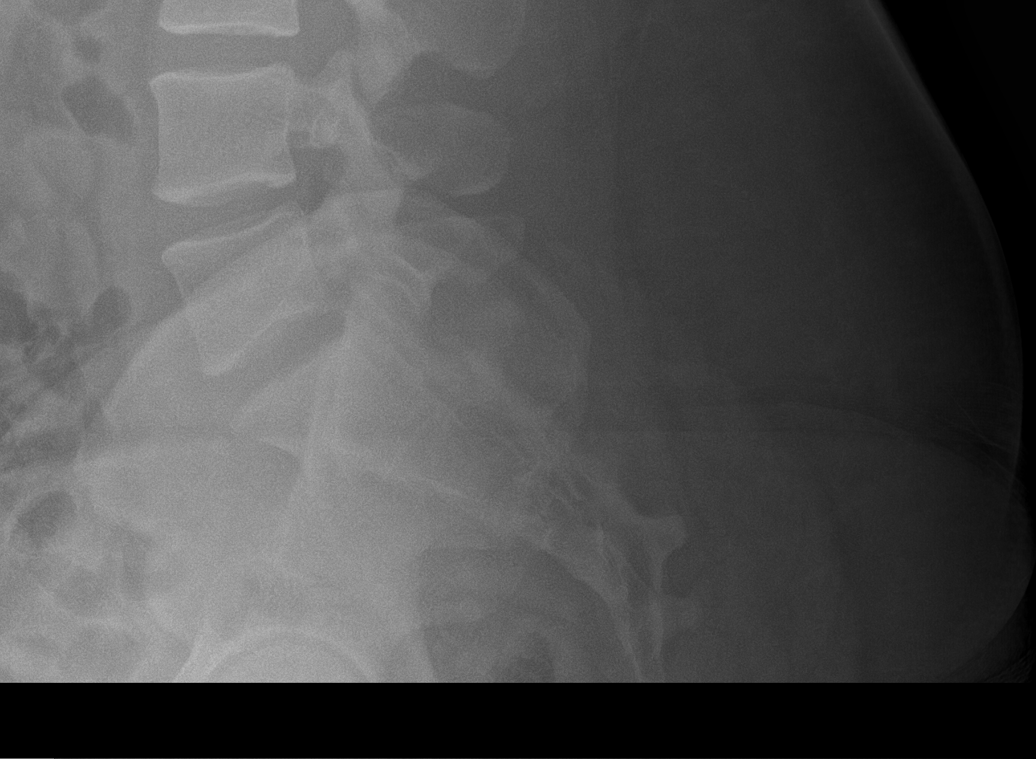

[5 of 5 positions shown; findings below may reference images not displayed]

FINDINGS: Five lumbar type vertebral bodies. No signs of malalignment or
fracture. Vertebral body heights and disc spaces are maintained.
IMPRESSION: Negative.

## 2022-03-27 ENCOUNTER — Ambulatory Visit: Payer: BC Managed Care – PPO | Admitting: Family Medicine

## 2022-03-27 ENCOUNTER — Encounter: Payer: Self-pay | Admitting: Family

## 2022-03-27 ENCOUNTER — Ambulatory Visit (INDEPENDENT_AMBULATORY_CARE_PROVIDER_SITE_OTHER): Payer: BC Managed Care – PPO | Admitting: Family

## 2022-03-27 ENCOUNTER — Ambulatory Visit: Payer: BC Managed Care – PPO | Admitting: Family

## 2022-03-27 VITALS — BP 140/100 | HR 83 | Temp 98.0°F | Ht 72.0 in | Wt 326.8 lb

## 2022-03-27 DIAGNOSIS — Z6841 Body Mass Index (BMI) 40.0 and over, adult: Secondary | ICD-10-CM | POA: Diagnosis not present

## 2022-03-27 DIAGNOSIS — R7309 Other abnormal glucose: Secondary | ICD-10-CM

## 2022-03-27 DIAGNOSIS — R030 Elevated blood-pressure reading, without diagnosis of hypertension: Secondary | ICD-10-CM | POA: Diagnosis not present

## 2022-03-27 DIAGNOSIS — R5383 Other fatigue: Secondary | ICD-10-CM

## 2022-03-27 MED ORDER — WEGOVY 0.25 MG/0.5ML ~~LOC~~ SOAJ: 0.2500 mg | SUBCUTANEOUS | 0 refills | Status: DC

## 2022-03-27 NOTE — Progress Notes (Signed)
Shawn Gray is a 31 y.o. male with the following history as recorded in EpicCare:  Patient Active Problem List   Diagnosis Date Noted   Peroneal tendinitis of lower leg, right 04/11/2021   Low back pain 10/17/2020   Nonallopathic lesion of sacral region 10/17/2020   Nonallopathic lesion of thoracic region 10/17/2020   Nonallopathic lesion of lumbar region 10/17/2020    Current Outpatient Medications  Medication Sig Dispense Refill   cyclobenzaprine (FLEXERIL) 10 MG tablet Take 1 tablet (10 mg total) by mouth 3 (three) times daily as needed for muscle spasms. 30 tablet 0   meloxicam (MOBIC) 15 MG tablet TAKE 1 TABLET (15 MG TOTAL) BY MOUTH DAILY. 30 tablet 0   Multiple Vitamin (MULTIVITAMIN WITH MINERALS) TABS tablet Take 1 tablet by mouth daily.     Semaglutide-Weight Management (WEGOVY) 0.25 MG/0.5ML SOAJ Inject 0.25 mg into the skin once a week. 2 mL 0   No current facility-administered medications for this visit.    Allergies: Other and Nickel  Past Medical History:  Diagnosis Date   Migraine    Obesity    Strep throat     Past Surgical History:  Procedure Laterality Date   MOUTH SURGERY      Family History  Problem Relation Age of Onset   Hypertension Mother    Diabetes Father    Heart disease Maternal Grandmother    Cancer Maternal Grandfather    Diabetes Paternal Grandmother     Social History   Tobacco Use   Smoking status: Never   Smokeless tobacco: Never  Substance Use Topics   Alcohol use: Yes    Comment: occ    Subjective:   Would like to discuss weight loss options- has tried numerous attempts- Keto/ Intermittent Fasting/ E2M (work out program);  Has tried [facility] ( through [facility])- did the program with his wife and unsuccessful; Works out [employer]- cardio daily and alternating weights;  Has gained 10 pounds in the past year;  Would like to discuss trial of Wegovy;  Average calorie intake is between 2800-3000 daily;       Objective:  Vitals:   03/27/22 1530 03/27/22 1556  BP: (!) 180/110 (!) 140/100  Pulse: 83   Temp: 98 F (36.7 C)   TempSrc: Oral   SpO2: 98%   Weight: (!) 326 lb 12.8 oz (148.2 kg)   Height: 6' (1.829 m)     General: Well developed, well nourished, in no acute distress  Skin : Warm and dry.  Head: Normocephalic and atraumatic  Lungs: Respirations unlabored; clear to auscultation bilaterally without wheeze, rales, rhonchi  CVS exam: normal rate and regular rhythm.  Musculoskeletal: No deformities; no active joint inflammation  Extremities: No edema, cyanosis, clubbing  Vessels: Symmetric bilaterally  Neurologic: Alert and oriented; speech intact; face symmetrical; moves all extremities well; CNII-XII intact without focal deficit   Assessment:  1. BMI 40.0-44.9, adult (HCC)   2. Other fatigue   3. Elevated glucose   4. Elevated blood pressure reading     Plan:  Update labs today; will give trial of Wegovy; have also asked patient to consider working with a personal trainer and nutritionist- he will look into what options he has through his health insurance company;  He is to check his blood pressure daily for the next week and forward those readings for review;   No follow-ups on file.  Orders Placed This Encounter  Procedures   CBC with Differential/Platelet   Comp  Met (CMET)   Hemoglobin A1c    Requested Prescriptions   Signed Prescriptions Disp Refills   Semaglutide-Weight Management (WEGOVY) 0.25 MG/0.5ML SOAJ 2 mL 0    Sig: Inject 0.25 mg into the skin once a week.

## 2022-03-28 ENCOUNTER — Telehealth: Payer: Self-pay

## 2022-03-28 LAB — CBC WITH DIFFERENTIAL/PLATELET
Basophils Absolute: 0.1 10*3/uL (ref 0.0–0.1)
Basophils Relative: 0.8 % (ref 0.0–3.0)
Eosinophils Absolute: 0.1 10*3/uL (ref 0.0–0.7)
Eosinophils Relative: 1.4 % (ref 0.0–5.0)
HCT: 49.7 % (ref 39.0–52.0)
Hemoglobin: 16.3 g/dL (ref 13.0–17.0)
Lymphocytes Relative: 28.6 % (ref 12.0–46.0)
Lymphs Abs: 2.2 10*3/uL (ref 0.7–4.0)
MCHC: 32.8 g/dL (ref 30.0–36.0)
MCV: 82.1 fl (ref 78.0–100.0)
Monocytes Absolute: 0.5 10*3/uL (ref 0.1–1.0)
Monocytes Relative: 5.8 % (ref 3.0–12.0)
Neutro Abs: 5 10*3/uL (ref 1.4–7.7)
Neutrophils Relative %: 63.4 % (ref 43.0–77.0)
Platelets: 284 10*3/uL (ref 150.0–400.0)
RBC: 6.06 Mil/uL — ABNORMAL HIGH (ref 4.22–5.81)
RDW: 15.4 % (ref 11.5–15.5)
WBC: 7.8 10*3/uL (ref 4.0–10.5)

## 2022-03-28 LAB — COMPREHENSIVE METABOLIC PANEL
ALT: 27 U/L (ref 0–53)
AST: 22 U/L (ref 0–37)
Albumin: 4.4 g/dL (ref 3.5–5.2)
Alkaline Phosphatase: 92 U/L (ref 39–117)
BUN: 12 mg/dL (ref 6–23)
CO2: 28 mEq/L (ref 19–32)
Calcium: 10.1 mg/dL (ref 8.4–10.5)
Chloride: 99 mEq/L (ref 96–112)
Creatinine, Ser: 1.16 mg/dL (ref 0.40–1.50)
GFR: 84.39 mL/min (ref >60.00)
Glucose, Bld: 84 mg/dL (ref 70–99)
Potassium: 4.4 mEq/L (ref 3.5–5.1)
Sodium: 138 mEq/L (ref 135–145)
Total Bilirubin: 0.5 mg/dL (ref 0.2–1.2)
Total Protein: 8 g/dL (ref 6.0–8.3)

## 2022-03-28 LAB — HEMOGLOBIN A1C: Hgb A1c MFr Bld: 6.2 % (ref 4.6–6.5)

## 2022-03-28 NOTE — Telephone Encounter (Signed)
PA initiated via Covermymeds; KEY: [id number]. Awaiting determination.

## 2022-03-28 NOTE — Telephone Encounter (Signed)
I have called the pt and notified him that the PA was approved. He stated understanding.

## 2022-03-28 NOTE — Telephone Encounter (Signed)
PA approved.   Request Reference Number: [id number]. WEGOVY INJ 0.25MG  is approved through 10/27/2022. Your patient may now fill this prescription and it will be covered.

## 2022-04-09 ENCOUNTER — Ambulatory Visit: Payer: Self-pay

## 2022-04-09 ENCOUNTER — Ambulatory Visit (INDEPENDENT_AMBULATORY_CARE_PROVIDER_SITE_OTHER): Payer: BC Managed Care – PPO

## 2022-04-09 ENCOUNTER — Ambulatory Visit (INDEPENDENT_AMBULATORY_CARE_PROVIDER_SITE_OTHER): Payer: BC Managed Care – PPO | Admitting: Family Medicine

## 2022-04-09 ENCOUNTER — Encounter: Payer: Self-pay | Admitting: Family Medicine

## 2022-04-09 VITALS — BP 146/104 | HR 82 | Ht 72.0 in | Wt 327.0 lb

## 2022-04-09 DIAGNOSIS — G8929 Other chronic pain: Secondary | ICD-10-CM | POA: Diagnosis not present

## 2022-04-09 DIAGNOSIS — M7671 Peroneal tendinitis, right leg: Secondary | ICD-10-CM

## 2022-04-09 DIAGNOSIS — M25571 Pain in right ankle and joints of right foot: Secondary | ICD-10-CM

## 2022-04-09 MED ORDER — VITAMIN D (ERGOCALCIFEROL) 1.25 MG (50000 UNIT) PO CAPS: 50000.0000 [IU] | ORAL_CAPSULE | ORAL | 0 refills | Status: DC

## 2022-04-09 NOTE — Progress Notes (Unsigned)
[name] [address] [place] [address] Phone: [phone] Subjective:   Shawn Gray, am serving as a scribe for Dr. Antoine Primas.  I'm seeing this patient by the request  of:  Olive Bass, FNP  CC: Back pain follow-up, ankle pain  [id number]  Shawn Gray is a 31 y.o. male coming in with complaint of R ankle pain. Last seen in Sept 2022. Insidious onset June 2023. Was on a cruise at time of onset. Swelling is in ankle mortise. Was wearing a water shoe the majority of the cruise. Does not have pain if he wears supportive shoe.   Back pain is better when he goes to the gym. Was sick a few weeks ago and his back pain increased. Uses massage gun between scapula and near neck.       Past Medical History:  Diagnosis Date   Migraine    Obesity    Strep throat    Past Surgical History:  Procedure Laterality Date   MOUTH SURGERY     Social History   Socioeconomic History   Marital status: Married    Spouse name: Not on file   Number of children: Not on file   Years of education: Not on file   Highest education level: Not on file  Occupational History   Not on file  Tobacco Use   Smoking status: Never   Smokeless tobacco: Never  Vaping Use   Vaping Use: Never used  Substance and Sexual Activity   Alcohol use: Yes    Comment: occ   Drug use: No   Sexual activity: Not on file  Other Topics Concern   Not on file  Social History Narrative   Not on file   Social Determinants of Health   Financial Resource Strain: Not on file  Food Insecurity: Not on file  Transportation Needs: Not on file  Physical Activity: Not on file  Stress: Not on file  Social Connections: Not on file   Allergies  Allergen Reactions   Other Anaphylaxis    Tree nuts   Nickel Rash    Topical allergy causes burning   Family History  Problem Relation Age of Onset   Hypertension Mother    Diabetes Father    Heart disease Maternal  Grandmother    Cancer Maternal Grandfather    Diabetes Paternal Grandmother        Current Outpatient Medications (Analgesics):    meloxicam (MOBIC) 15 MG tablet, TAKE 1 TABLET (15 MG TOTAL) BY MOUTH DAILY.   Current Outpatient Medications (Other):    cyclobenzaprine (FLEXERIL) 10 MG tablet, Take 1 tablet (10 mg total) by mouth 3 (three) times daily as needed for muscle spasms.   Multiple Vitamin (MULTIVITAMIN WITH MINERALS) TABS tablet, Take 1 tablet by mouth daily.   Semaglutide-Weight Management (WEGOVY) 0.25 MG/0.5ML SOAJ, Inject 0.25 mg into the skin once a week.   Vitamin D, Ergocalciferol, (DRISDOL) 1.25 MG (50000 UNIT) CAPS capsule, Take 1 capsule (50,000 Units total) by mouth every 7 (seven) days.   Reviewed prior external information including notes and imaging from  primary care provider As well as notes that were available from care everywhere and other healthcare systems.  Past medical history, social, surgical and family history all reviewed in electronic medical record.  No pertanent information unless stated regarding to the chief complaint.   Review of Systems:  No headache, visual changes, nausea, vomiting, diarrhea, constipation, dizziness, abdominal pain, skin rash, fevers,  chills, night sweats, weight loss, swollen lymph nodes, body aches, joint swelling, chest pain, shortness of breath, mood changes. POSITIVE muscle aches  Objective  Blood pressure (!) 146/104, pulse 82, height 6' (1.829 m), weight (!) 327 lb (148.3 kg), SpO2 98 %.   General: No apparent distress alert and oriented x3 mood and affect normal, dressed appropriately.  HEENT: Pupils equal, extraocular movements intact  Respiratory: Patient's speak in full sentences and does not appear short of breath  Cardiovascular: No lower extremity edema, non tender, no erythema  Right ankle exam shows severe pes planus with overpronation of the foot.  Trace effusion noted of the ankle.  Some limited range of  motion noted.  Still tenderness over the posterior tibialis tendon. Limited muscular skeletal ultrasound was performed and interpreted by Hulan Saas, M   Limited ultrasound of patient's right ankle shows that there is hypoechoic changes coming out of the anterior lateral ankle mortise.  Patient also has some hypoechoic changes at the peroneal tendons just inferior and posterior to the fibular malleolus.  No cortical irregularity noted. Impression: Concern for ankle effusion and continued peroneal tendinitis    Impression and Recommendations:    The above documentation has been reviewed and is accurate and complete Lyndal Pulley, DO

## 2022-04-09 NOTE — Patient Instructions (Signed)
Exercises Thoracic exercises Note for adjustable standing desk Heel lifts Once weekly Vit D Xray R ankle HOKA recovery sandals See me in 6-8 weeks

## 2022-04-10 ENCOUNTER — Encounter: Payer: Self-pay | Admitting: Family Medicine

## 2022-04-10 NOTE — Assessment & Plan Note (Signed)
Patient does have some peroneal tendinitis but also has pes planus and concern for possible OCD of the ankle with some trace effusion noted on ultrasound today.  Discussed with patient about icing regimen and home exercises, we discussed which activities to do and which ones to avoid.  Increase activity slowly otherwise.  Follow-up with me again in 6 to 8 weeks.

## 2022-05-15 ENCOUNTER — Other Ambulatory Visit: Payer: Self-pay | Admitting: Family

## 2022-05-15 ENCOUNTER — Ambulatory Visit (INDEPENDENT_AMBULATORY_CARE_PROVIDER_SITE_OTHER): Payer: BC Managed Care – PPO | Admitting: Family

## 2022-05-15 ENCOUNTER — Encounter: Payer: Self-pay | Admitting: Family

## 2022-05-15 DIAGNOSIS — G8929 Other chronic pain: Secondary | ICD-10-CM

## 2022-05-15 DIAGNOSIS — R030 Elevated blood-pressure reading, without diagnosis of hypertension: Secondary | ICD-10-CM

## 2022-05-15 DIAGNOSIS — M25579 Pain in unspecified ankle and joints of unspecified foot: Secondary | ICD-10-CM | POA: Diagnosis not present

## 2022-05-15 DIAGNOSIS — M545 Low back pain, unspecified: Secondary | ICD-10-CM

## 2022-05-15 DIAGNOSIS — R899 Unspecified abnormal finding in specimens from other organs, systems and tissues: Secondary | ICD-10-CM

## 2022-05-15 LAB — COMPREHENSIVE METABOLIC PANEL
ALT: 30 U/L (ref 0–53)
AST: 20 U/L (ref 0–37)
Albumin: 4.2 g/dL (ref 3.5–5.2)
Alkaline Phosphatase: 91 U/L (ref 39–117)
BUN: 10 mg/dL (ref 6–23)
CO2: 29 mEq/L (ref 19–32)
Calcium: 9.3 mg/dL (ref 8.4–10.5)
Chloride: 102 mEq/L (ref 96–112)
Creatinine, Ser: 1.02 mg/dL (ref 0.40–1.50)
GFR: 98.39 mL/min (ref >60.00)
Glucose, Bld: 98 mg/dL (ref 70–99)
Potassium: 4.2 mEq/L (ref 3.5–5.1)
Sodium: 139 mEq/L (ref 135–145)
Total Bilirubin: 0.6 mg/dL (ref 0.2–1.2)
Total Protein: 7.4 g/dL (ref 6.0–8.3)

## 2022-05-15 LAB — CBC WITH DIFFERENTIAL/PLATELET
Basophils Absolute: 0 10*3/uL (ref 0.0–0.1)
Basophils Relative: 0.5 % (ref 0.0–3.0)
Eosinophils Absolute: 0.1 10*3/uL (ref 0.0–0.7)
Eosinophils Relative: 2.2 % (ref 0.0–5.0)
HCT: 46.1 % (ref 39.0–52.0)
Hemoglobin: 15.2 g/dL (ref 13.0–17.0)
Lymphocytes Relative: 19 % (ref 12.0–46.0)
Lymphs Abs: 1.3 10*3/uL (ref 0.7–4.0)
MCHC: 33 g/dL (ref 30.0–36.0)
MCV: 82.5 fl (ref 78.0–100.0)
Monocytes Absolute: 0.5 10*3/uL (ref 0.1–1.0)
Monocytes Relative: 7 % (ref 3.0–12.0)
Neutro Abs: 4.8 10*3/uL (ref 1.4–7.7)
Neutrophils Relative %: 71.3 % (ref 43.0–77.0)
Platelets: 316 10*3/uL (ref 150.0–400.0)
RBC: 5.59 Mil/uL (ref 4.22–5.81)
RDW: 15.1 % (ref 11.5–15.5)
WBC: 6.8 10*3/uL (ref 4.0–10.5)

## 2022-05-15 LAB — URIC ACID: Uric Acid, Serum: 6 mg/dL (ref 4.0–7.8)

## 2022-05-15 MED ORDER — CYCLOBENZAPRINE HCL 10 MG PO TABS: 10.0000 mg | ORAL_TABLET | Freq: Three times a day (TID) | ORAL | 0 refills | Status: DC | PRN

## 2022-05-15 NOTE — Progress Notes (Signed)
Shawn Gray is a 31 y.o. male with the following history as recorded in EpicCare:  Patient Active Problem List   Diagnosis Date Noted   Peroneal tendinitis of lower leg, right 04/11/2021   Low back pain 10/17/2020   Nonallopathic lesion of sacral region 10/17/2020   Nonallopathic lesion of thoracic region 10/17/2020   Nonallopathic lesion of lumbar region 10/17/2020    Current Outpatient Medications  Medication Sig Dispense Refill   Multiple Vitamin (MULTIVITAMIN WITH MINERALS) TABS tablet Take 1 tablet by mouth daily.     Vitamin D, Ergocalciferol, (DRISDOL) 1.25 MG (50000 UNIT) CAPS capsule Take 1 capsule (50,000 Units total) by mouth every 7 (seven) days. 12 capsule 0   cyclobenzaprine (FLEXERIL) 10 MG tablet Take 1 tablet (10 mg total) by mouth 3 (three) times daily as needed for muscle spasms. 30 tablet 0   meloxicam (MOBIC) 15 MG tablet TAKE 1 TABLET (15 MG TOTAL) BY MOUTH DAILY. (Patient not taking: Reported on 05/15/2022) 30 tablet 0   Semaglutide-Weight Management (WEGOVY) 0.25 MG/0.5ML SOAJ Inject 0.25 mg into the skin once a week. (Patient not taking: Reported on 05/15/2022) 2 mL 0   No current facility-administered medications for this visit.    Allergies: Other and Nickel  Past Medical History:  Diagnosis Date   Migraine    Obesity    Strep throat     Past Surgical History:  Procedure Laterality Date   MOUTH SURGERY      Family History  Problem Relation Age of Onset   Hypertension Mother    Diabetes Father    Heart disease Maternal Grandmother    Cancer Maternal Grandfather    Diabetes Paternal Grandmother     Social History   Tobacco Use   Smoking status: Never   Smokeless tobacco: Never  Substance Use Topics   Alcohol use: Yes    Comment: occ    Subjective:   Known history of low back pain/ ankle- has been working with sports medicine provider; has appointment there next Monday; had a flare of ankle and back pain over the weekend- did not have any  current Rx for muscle relaxer; notes that ankle pain "was different" time- felt tender to touch over ankle bone; no known history of gout;   Feels that his blood pressure is only elevated because he is in pain; notes that is averaging 140/ 90; Denies any chest pain, shortness of breath, blurred vision or headache.     Objective:  Vitals:   05/15/22 0853 05/15/22 0917  BP: (!) 160/100 (!) 162/100  Pulse: 91   Temp: 98.5 F (36.9 C)   TempSrc: Oral   SpO2: 98%   Weight: (!) 326 lb 9.6 oz (148.1 kg)   Height: 6' (1.829 m)     General: Well developed, well nourished, in no acute distress  Skin : Warm and dry.  Head: Normocephalic and atraumatic  Eyes: Sclera and conjunctiva clear; pupils round and reactive to light; extraocular movements intact  Ears: External normal; canals clear; tympanic membranes normal  Oropharynx: Pink, supple. No suspicious lesions  Neck: Supple without thyromegaly, adenopathy  Lungs: Respirations unlabored;  Musculoskeletal: No deformities; no active joint inflammation  Extremities: No edema, cyanosis, clubbing  Vessels: Symmetric bilaterally  Neurologic: Alert and oriented; speech intact; face symmetrical; moves all extremities well; CNII-XII intact without focal deficit   Assessment:  1. Morbid obesity ([place])   2. Ankle pain, unspecified chronicity, unspecified laterality   3. Elevated blood pressure reading   4.  Chronic left-sided low back pain without sciatica     Plan:  Refer to [facility]; suspect that weight loss will be beneficial for back pain; ? Gout vs chronic issue; check uric acid level; keep planned follow up with sports medicine for next week; Would like to start patient on blood pressure medication but he declines; he will continue to work on diet and exercise;  Refill updated on Flexeril; keep planned appointment with sports medicine for next week;   No follow-ups on file.  Orders Placed This Encounter  Procedures    CBC with Differential/Platelet   Comp Met (CMET)   Uric acid   Amb Ref to Medical Weight [facility]    Referral Priority:   Routine    Referral Type:   Consultation    Number of Visits Requested:   1    Requested Prescriptions   Signed Prescriptions Disp Refills   cyclobenzaprine (FLEXERIL) 10 MG tablet 30 tablet 0    Sig: Take 1 tablet (10 mg total) by mouth 3 (three) times daily as needed for muscle spasms.

## 2022-05-16 NOTE — Progress Notes (Signed)
[place] [facility] [place] Phone: [phone] Subjective:   [name], am serving as a scribe for Dr. Hulan Saas.  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  Discussed with patient about icing regimen and home exercises, we discussed which activities to do and which ones to avoid.  Increase activity slowly otherwise.  Follow-up with me again in 6 to 8 weeks.   Update 05/21/2022 Venton Dippold is a 31 y.o. male coming in with complaint of R ankle pain.  Discussed with patient and he elected to try conservative therapy for the peroneal tendinitis but there was a concern for an OCD.  Patient states that he had back spasms last week and ankle pain increased. Took 800mg  of IBU early this morning due to pain in back. Today pain has subsided in ankle. Using ace at work for support.   Reviewing patient's most recent primary care visit has been referred to healthy weight and wellness and was given a refill for the Flexeril for the back pain.  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    Past Medical History:  Diagnosis Date   Migraine    Obesity    Strep throat    Past Surgical History:  Procedure Laterality Date   MOUTH SURGERY     Social History   Socioeconomic History   Marital status: Married    Spouse name: Not on file   Number of children: Not on file   Years of education: Not on file   Highest education level: Not on file  Occupational History   Not on file  Tobacco Use    Smoking status: Never   Smokeless tobacco: Never  Vaping Use   Vaping Use: Never used  Substance and Sexual Activity   Alcohol use: Yes    Comment: occ   Drug use: No   Sexual activity: Not on file  Other Topics Concern   Not on file  Social History Narrative   Not on file   Social Determinants of Health   Financial Resource Strain: Not on file  Food Insecurity: Not on file  Transportation Needs: Not on file  Physical Activity: Not on file  Stress: Not on file  Social Connections: Not on file   Allergies  Allergen Reactions   Other Anaphylaxis    Tree nuts   Nickel Rash    Topical allergy causes burning   Family History  Problem Relation Age of Onset   Hypertension Mother    Diabetes Father    Heart disease Maternal Grandmother    Cancer Maternal Grandfather    Diabetes Paternal Grandmother     Current Outpatient Medications (Endocrine & Metabolic):    predniSONE (DELTASONE) 20 MG tablet, Take 2 tablets (40 mg total) by mouth daily with breakfast.    Current Outpatient Medications (Analgesics):    meloxicam (MOBIC) 15 MG tablet, TAKE 1 TABLET (15 MG TOTAL) BY MOUTH DAILY.   Current Outpatient Medications (Other):    cyclobenzaprine (FLEXERIL) 10 MG tablet, Take  1 tablet (10 mg total) by mouth 3 (three) times daily as needed for muscle spasms.   diazepam (VALIUM) 5 MG tablet, One tab by mouth, 2 hours before procedure.   Multiple Vitamin (MULTIVITAMIN WITH MINERALS) TABS tablet, Take 1 tablet by mouth daily.   Semaglutide-Weight Management (WEGOVY) 0.25 MG/0.5ML SOAJ, Inject 0.25 mg into the skin once a week.   Vitamin D, Ergocalciferol, (DRISDOL) 1.25 MG (50000 UNIT) CAPS capsule, Take 1 capsule (50,000 Units total) by mouth every 7 (seven) days.   Reviewed prior external information including notes and imaging from  primary care provider As well as notes that were available from care everywhere and other healthcare systems.  Past medical history, social,  surgical and family history all reviewed in electronic medical record.  No pertanent information unless stated regarding to the chief complaint.   Review of Systems:  No headache, visual changes, nausea, vomiting, diarrhea, constipation, dizziness, abdominal pain, skin rash, fevers, chills, night sweats, weight loss, swollen lymph nodes, body aches, joint swelling, chest pain, shortness of breath, mood changes. POSITIVE muscle aches  Objective  Blood pressure (!) 140/102, pulse 75, height 6' (1.829 m), weight (!) 328 lb (148.8 kg), SpO2 99 %.   General: No apparent distress alert and oriented x3 mood and affect normal, dressed appropriately.  HEENT: Pupils equal, extraocular movements intact  Respiratory: Patient's speak in full sentences and does not appear short of breath  Cardiovascular: No lower extremity edema, non tender, no erythema  Right ankle exam shows patient does have some limited range of motion, overpronation of the hindfoot with breakdown of the longitudinal arch Low back exam does have some loss of lordosis.  Some tenderness to palpation in the paraspinal musculature as well.  Poor core strength noted.  Limited muscular skeletal ultrasound was performed and interpreted by Hulan Saas, M  Limited ultrasound of patient's ankle shows patient does have hypoechoic changes noted.  Still some narrowing of the talar area noted.  Peroneal tendon still has some hypoechoic changes noted. Impression, improvement noted of the ankle effusion but still not back to baseline  Osteopathic findings C4 flexed rotated and side bent left C6 flexed rotated and side bent left T3 extended rotated and side bent right inhaled third rib T9 extended rotated and side bent left L2 flexed rotated and side bent right Sacrum right on right     Impression and Recommendations:    The above documentation has been reviewed and is accurate and complete Lyndal Pulley, DO

## 2022-05-21 ENCOUNTER — Ambulatory Visit (INDEPENDENT_AMBULATORY_CARE_PROVIDER_SITE_OTHER): Payer: BC Managed Care – PPO | Admitting: Family Medicine

## 2022-05-21 ENCOUNTER — Encounter: Payer: Self-pay | Admitting: Family Medicine

## 2022-05-21 ENCOUNTER — Ambulatory Visit: Payer: Self-pay

## 2022-05-21 VITALS — BP 140/102 | HR 75 | Ht 72.0 in | Wt 328.0 lb

## 2022-05-21 DIAGNOSIS — M7671 Peroneal tendinitis, right leg: Secondary | ICD-10-CM

## 2022-05-21 DIAGNOSIS — M9903 Segmental and somatic dysfunction of lumbar region: Secondary | ICD-10-CM

## 2022-05-21 DIAGNOSIS — M9904 Segmental and somatic dysfunction of sacral region: Secondary | ICD-10-CM | POA: Diagnosis not present

## 2022-05-21 DIAGNOSIS — M9902 Segmental and somatic dysfunction of thoracic region: Secondary | ICD-10-CM

## 2022-05-21 DIAGNOSIS — M999 Biomechanical lesion, unspecified: Secondary | ICD-10-CM

## 2022-05-21 DIAGNOSIS — G8929 Other chronic pain: Secondary | ICD-10-CM | POA: Diagnosis not present

## 2022-05-21 DIAGNOSIS — M25571 Pain in right ankle and joints of right foot: Secondary | ICD-10-CM

## 2022-05-21 DIAGNOSIS — M545 Low back pain, unspecified: Secondary | ICD-10-CM | POA: Diagnosis not present

## 2022-05-21 MED ORDER — PREDNISONE 20 MG PO TABS: 40.0000 mg | ORAL_TABLET | Freq: Every day | ORAL | 0 refills | Status: DC

## 2022-05-21 MED ORDER — DIAZEPAM 5 MG PO TABS: ORAL_TABLET | ORAL | 0 refills | Status: DC

## 2022-05-21 NOTE — Patient Instructions (Addendum)
Great to see you Prednisone 40mg  for 5 days Flexeril at night but do not taken it with Valium See me in 6-8 weeks

## 2022-05-21 NOTE — Assessment & Plan Note (Signed)
Low back  Does have muscle imbalances.  Encouraged him to continue the home exercises.  Prednisone given for his trip.  Discussed the Flexeril.  Given some Valium for his trip.  Warned of potential side effects.  Discussed which activities to do and which ones to avoid.  Discussed the importance of better shoes.  Follow-up again in 6 to 8 weeks

## 2022-05-21 NOTE — Assessment & Plan Note (Signed)
Still some mild hypoechoic changes but does have the narrowing of the talar joint.  We discussed with patient.  When patient travels we do need to consider the possibility of advanced imaging.  Patient is willing to do this if he does having difficulty on his trip otherwise wants to continue conservative therapy patient has described some instability of the ankle over the course of time but will continue to work on weight loss.  Follow-up again in 6 to 8 weeks

## 2022-05-22 NOTE — Assessment & Plan Note (Signed)

## 2022-06-08 ENCOUNTER — Other Ambulatory Visit: Payer: Self-pay

## 2022-06-08 ENCOUNTER — Emergency Department: Payer: BC Managed Care – PPO

## 2022-06-08 DIAGNOSIS — J101 Influenza due to other identified influenza virus with other respiratory manifestations: Secondary | ICD-10-CM | POA: Diagnosis not present

## 2022-06-08 DIAGNOSIS — Z1152 Encounter for screening for COVID-19: Secondary | ICD-10-CM | POA: Insufficient documentation

## 2022-06-08 DIAGNOSIS — R0602 Shortness of breath: Secondary | ICD-10-CM | POA: Diagnosis not present

## 2022-06-08 LAB — CBC WITH DIFFERENTIAL/PLATELET
Abs Immature Granulocytes: 0.02 10*3/uL (ref 0.00–0.07)
Basophils Absolute: 0 10*3/uL (ref 0.0–0.1)
Basophils Relative: 0 %
Eosinophils Absolute: 0.1 10*3/uL (ref 0.0–0.5)
Eosinophils Relative: 1 %
HCT: 42.5 % (ref 39.0–52.0)
Hemoglobin: 14.2 g/dL (ref 13.0–17.0)
Immature Granulocytes: 0 %
Lymphocytes Relative: 25 %
Lymphs Abs: 1.9 10*3/uL (ref 0.7–4.0)
MCH: 26.8 pg (ref 26.0–34.0)
MCHC: 33.4 g/dL (ref 30.0–36.0)
MCV: 80.3 fL (ref 80.0–100.0)
Monocytes Absolute: 0.9 10*3/uL (ref 0.1–1.0)
Monocytes Relative: 12 %
Neutro Abs: 4.5 10*3/uL (ref 1.7–7.7)
Neutrophils Relative %: 62 %
Platelets: 287 10*3/uL (ref 150–400)
RBC: 5.29 MIL/uL (ref 4.22–5.81)
RDW: 14.3 % (ref 11.5–15.5)
WBC: 7.4 10*3/uL (ref 4.0–10.5)
nRBC: 0 % (ref 0.0–0.2)

## 2022-06-08 LAB — BASIC METABOLIC PANEL
Anion gap: 12 (ref 5–15)
BUN: 10 mg/dL (ref 6–20)
CO2: 22 mmol/L (ref 22–32)
Calcium: 8.9 mg/dL (ref 8.9–10.3)
Chloride: 102 mmol/L (ref 98–111)
Creatinine, Ser: 1.32 mg/dL — ABNORMAL HIGH (ref 0.61–1.24)
GFR, Estimated: >60 mL/min (ref >60)
Glucose, Bld: 139 mg/dL — ABNORMAL HIGH (ref 70–99)
Potassium: 3.3 mmol/L — ABNORMAL LOW (ref 3.5–5.1)
Sodium: 136 mmol/L (ref 135–145)

## 2022-06-08 LAB — RESP PANEL BY RT-PCR (FLU A&B, COVID) ARPGX2
Influenza A by PCR: POSITIVE — AB
Influenza B by PCR: NEGATIVE
SARS Coronavirus 2 by RT PCR: NEGATIVE

## 2022-06-08 LAB — TROPONIN I (HIGH SENSITIVITY): Troponin I (High Sensitivity): 11 ng/L (ref <18)

## 2022-06-08 LAB — BRAIN NATRIURETIC PEPTIDE: B Natriuretic Peptide: 11.8 pg/mL (ref 0.0–100.0)

## 2022-06-08 NOTE — ED Triage Notes (Signed)
First Nurse Note: Pt reports flu-like symptoms x1 week. Pt also reports CP and is diaphoretic on arrival.

## 2022-06-08 NOTE — ED Triage Notes (Signed)
Pt to ED by self. Pt has been sick since Sunday. He just got back from [place]. Pt wife was sick as well but she has gotten better and he hasn't gotten better. Pt is having pain in his lower back and SOB. Pt has coughing with pain upon coughing and inspiration. Pt is coughing up mucous that is clear to brown. Pt in no acute distress at this time.

## 2022-06-08 NOTE — ED Provider Triage Note (Signed)
Emergency Medicine Provider Triage Evaluation Note  Shawn Gray , a 31 y.o. male  was evaluated in triage.  Pt complains of shortness of breath. Symptoms started 6 days ago when traveling back from [place]. Wife had similar symptoms, but is now better. He has also had harsh cough and fever. He tested him for COVID, but was negative.  Physical Exam  There were no vitals taken for this visit. Gen:   Awake, no distress   Resp:  Normal effort  MSK:   Moves extremities without difficulty  Other:    Medical Decision Making  Medically screening exam initiated at 7:01 PM.  Appropriate orders placed.  Tin Engram was informed that the remainder of the evaluation will be completed by another provider, this initial triage assessment does not replace that evaluation, and the importance of remaining in the ED until their evaluation is complete.    Chinita Pester, FNP 06/08/22 1906

## 2022-06-09 ENCOUNTER — Emergency Department
Admission: EM | Admit: 2022-06-09 | Discharge: 2022-06-09 | Disposition: A | Discharge status: Home / Self Care | Payer: BC Managed Care – PPO | Attending: Emergency Medicine | Admitting: Emergency Medicine

## 2022-06-09 DIAGNOSIS — J101 Influenza due to other identified influenza virus with other respiratory manifestations: Secondary | ICD-10-CM

## 2022-06-09 NOTE — ED Provider Notes (Signed)
[facility] Provider Note    Event Date/Time   First MD Initiated Contact with Patient 06/09/22 0100     (approximate)   History   Shortness of Breath   HPI  Shawn Gray is a 31 y.o. male who presents for a variety of complaints including general malaise, fever and chills, nasal congestion, cough, generalized body aches, and pain in his chest when he coughs.  The symptoms have been present for about 6 days.  He just recently got back from [place] and his wife was sick as well but she got better and he has not improved.  He said his symptoms are worse than she was having.  He has no known heart disease or lung disease.  He does not smoke.  He is obese but has no other medical issues other than some chronic lumbar pain.  He said he has not felt well and has not been eating or drinking as much as he should.  No chest pain except for when he coughs.     Physical Exam   Triage Vital Signs: ED Triage Vitals  Enc Vitals Group     BP 06/08/22 1902 (!) 151/105     Pulse Rate 06/08/22 1902 98     Resp 06/08/22 1902 18     Temp 06/08/22 1902 99.2 F (37.3 C)     Temp Source 06/08/22 1902 Oral     SpO2 06/08/22 1902 92 %     Weight 06/08/22 1903 (!) 147.4 kg (325 lb)     Height 06/08/22 1903 1.829 m (6')     Head Circumference --      Peak Flow --      Pain Score 06/08/22 1903 0     Pain Loc --      Pain Edu? --      Excl. in GC? --     Most recent vital signs: Vitals:   06/09/22 0046 06/09/22 0233  BP: (!) 157/93 (!) 142/96  Pulse: 80 72  Resp: 19   Temp: 98.3 F (36.8 C) 98.1 F (36.7 C)  SpO2: 97% 96%     General: Awake, no distress.  Appears ill as it from a viral infection but definitely nontoxic. CV:  Good peripheral perfusion.  Regular rate and rhythm.  Normal heart sounds. Resp:  Normal effort.  Lungs are clear to auscultation bilaterally.  Occasional cough.  No wheezing. Abd:  Morbid obesity.  No tenderness to palpation. Other:  Awake  and alert, no focal neurological deficits.   ED Results / Procedures / Treatments   Labs (all labs ordered are listed, but only abnormal results are displayed) Labs Reviewed  RESP PANEL BY RT-PCR (FLU A&B, COVID) ARPGX2 - Abnormal; Notable for the following components:      Result Value   Influenza A by PCR POSITIVE (*)    All other components within normal limits  BASIC METABOLIC PANEL - Abnormal; Notable for the following components:   Potassium 3.3 (*)    Glucose, Bld 139 (*)    Creatinine, Ser 1.32 (*)    All other components within normal limits  CBC WITH DIFFERENTIAL/PLATELET  BRAIN NATRIURETIC PEPTIDE  TROPONIN I (HIGH SENSITIVITY)     EKG  ED ECG REPORT I, Loleta Rose, the attending physician, personally viewed and interpreted this ECG.  Date: 06/08/2022 EKG Time: 19: 11 Rate: 98 Rhythm: normal sinus rhythm QRS Axis: normal Intervals: normal ST/T Wave abnormalities: Non-specific ST segment / T-wave changes, but  no clear evidence of acute ischemia.  Most notably the patient has inverted T waves in lead III, otherwise unremarkable Narrative Interpretation: no definitive evidence of acute ischemia; does not meet STEMI criteria.    RADIOLOGY I viewed and interpreted the patient's two-view chest x-ray and I see no evidence of pneumonia or pulmonary edema.  I also read the radiologist's report, which confirmed no acute findings.    PROCEDURES:  Critical Care performed: No  Procedures   MEDICATIONS ORDERED IN ED: Medications - No data to display   IMPRESSION / MDM / ASSESSMENT AND PLAN / ED COURSE  I reviewed the triage vital signs and the nursing notes.                              Differential diagnosis includes, but is not limited to, viral illness including influenza, COVID-19, and RSV, community-acquired pneumonia, electrolyte or metabolic abnormality, new onset CHF, sleep apnea.  Patient's presentation is most consistent with acute presentation  with potential threat to life or bodily function.  Labs/studies ordered: Two-view chest x-ray, EKG, CBC with differential, BNP, high-sensitivity troponin x2, respiratory viral panel, basic metabolic panel.  Labs are generally reassuring.  He has a very slight elevation of his creatinine which may represent a slight AKI but his GFR still greater than 60.  I encouraged him to drink more fluids.  His respiratory viral panel is positive for influenza A which is very consistent with his symptoms.  His CBC and BMP as well as his high-sensitivity troponins are within normal limits.  EKG shows no clear evidence of ischemia and his chest x-ray is clear.  I explained to the patient his diagnosis, no role for antibiotics, symptoms of 6 days precludes the use of Tamiflu which is of very unclear value regardless.  He says he understands.  I encouraged ibuprofen, Tylenol, plenty of fluids, and outpatient follow-up as needed.  I gave my usual and customary return precautions.       FINAL CLINICAL IMPRESSION(S) / ED DIAGNOSES   Final diagnoses:  Influenza A     Rx / DC Orders   ED Discharge Orders     None        Note:  This document was prepared using Dragon voice recognition software and may include unintentional dictation errors.   Loleta Rose, MD 06/09/22 [phone]

## 2022-06-27 NOTE — Progress Notes (Signed)
[name] [address] [place] [address] Phone: [phone] Subjective:   Shawn Gray, am serving as a scribe for Dr. Antoine Primas.  I'm seeing this patient by the request  of:  Olive Bass, FNP  CC: Ankle pain follow-up and back pain  MNO:[id number]  05/21/2022 Still some mild hypoechoic changes but does have the narrowing of the talar joint.  We discussed with patient.  When patient travels we do need to consider the possibility of advanced imaging.  Patient is willing to do this if he does having difficulty on his trip otherwise wants to continue conservative therapy patient has described some instability of the ankle over the course of time but will continue to work on weight loss.  Follow-up again in 6 to 8 weeks      Update 07/02/2022 Shawn Gray is a 31 y.o. male coming in with complaint of back and neck pain. OMT 05/21/2022. Also f/u for R ankle pain. Patient states here for routine OMT, low back is area of concern and shoulders as well. Patient states that the ankle hurt today and took a prednisone and the swelling went down and helped.   Medications patient has been prescribed: Valium, Prednisone  Taking:         Reviewed prior external information including notes and imaging from previsou exam, outside providers and external EMR if available.   As well as notes that were available from care everywhere and other healthcare systems. Since we have seen patient 3 weeks ago was diagnosed with flu  Past medical history, social, surgical and family history all reviewed in electronic medical record.  No pertanent information unless stated regarding to the chief complaint.   Past Medical History:  Diagnosis Date   Migraine    Obesity    Strep throat     Allergies  Allergen Reactions   Other Anaphylaxis    Tree nuts   Nickel Rash    Topical allergy causes burning     Review of Systems:  No headache, visual  changes, nausea, vomiting, diarrhea, constipation, dizziness, abdominal pain, skin rash, fevers, chills, night sweats, weight loss, swollen lymph nodes, body aches, joint swelling, chest pain, shortness of breath, mood changes. POSITIVE muscle aches  Objective  Blood pressure (!) 142/90, pulse 91, height 6' (1.829 m), weight (!) 320 lb (145.2 kg), SpO2 98 %.   General: No apparent distress alert and oriented x3 mood and affect normal, dressed appropriately.  HEENT: Pupils equal, extraocular movements intact  Respiratory: Patient's speak in full sentences and does not appear short of breath  Cardiovascular: No lower extremity edema, non tender, no erythema  MSK:  Back  Foot exam that shows breakdown of the longitudinal arch with overpronation of the hindfoot.  Back does have some loss of lordosis noted.  Patient does have some limited range of motion in certain areas.  Tightness with Pearlean Brownie right greater than left.   Osteopathic findings  C2 flexed rotated and side bent right C6 flexed rotated and side bent left T3 extended rotated and side bent right inhaled rib T9 extended rotated and side bent left L3 flexed rotated and side bent right Sacrum left on left       Assessment and Plan:  Peroneal tendinitis of lower leg, right Stable at this time.  Unfortunately did not have the humerus for him.  Discussed icing regimen and home exercises, discussed which activities to do and which ones to avoid.  Patient  will continue to wear good shoes and recovery sandals at home.  Follow-up with me again in 6 to 8 weeks.  Low back pain Patient is doing better overall.  Continues to strengthen overall.  Patient is continuing to monitor his weight.  I do believe the patient looks healthy overall and I think that this is contributing to why patient is doing better.  He does respond well to osteopathic manipulation.  Follow-up again 8 weeks     Nonallopathic problems  Decision today to treat with  OMT was based on Physical Exam  After verbal consent patient was treated with HVLA, ME, FPR techniques in cervical, rib, thoracic, lumbar, and sacral  areas  Patient tolerated the procedure well with improvement in symptoms  Patient given exercises, stretches and lifestyle modifications  See medications in patient instructions if given  Patient will follow up in 4-8 weeks    The above documentation has been reviewed and is accurate and complete Judi Saa, DO          Note: This dictation was prepared with Dragon dictation along with smaller phrase technology. Any transcriptional errors that result from this process are unintentional.

## 2022-07-02 ENCOUNTER — Ambulatory Visit (INDEPENDENT_AMBULATORY_CARE_PROVIDER_SITE_OTHER): Payer: BC Managed Care – PPO | Admitting: Family Medicine

## 2022-07-02 ENCOUNTER — Encounter: Payer: Self-pay | Admitting: Family Medicine

## 2022-07-02 VITALS — BP 142/90 | HR 91 | Ht 72.0 in | Wt 320.0 lb

## 2022-07-02 DIAGNOSIS — M545 Low back pain, unspecified: Secondary | ICD-10-CM | POA: Diagnosis not present

## 2022-07-02 DIAGNOSIS — M9901 Segmental and somatic dysfunction of cervical region: Secondary | ICD-10-CM | POA: Diagnosis not present

## 2022-07-02 DIAGNOSIS — M7671 Peroneal tendinitis, right leg: Secondary | ICD-10-CM | POA: Diagnosis not present

## 2022-07-02 DIAGNOSIS — M9903 Segmental and somatic dysfunction of lumbar region: Secondary | ICD-10-CM | POA: Diagnosis not present

## 2022-07-02 DIAGNOSIS — M9908 Segmental and somatic dysfunction of rib cage: Secondary | ICD-10-CM

## 2022-07-02 DIAGNOSIS — M9902 Segmental and somatic dysfunction of thoracic region: Secondary | ICD-10-CM

## 2022-07-02 DIAGNOSIS — M9904 Segmental and somatic dysfunction of sacral region: Secondary | ICD-10-CM | POA: Diagnosis not present

## 2022-07-02 DIAGNOSIS — G8929 Other chronic pain: Secondary | ICD-10-CM

## 2022-07-02 NOTE — Patient Instructions (Addendum)
Good to see you  We can call you once we get in large heel lifts Follow up in 2 months

## 2022-07-02 NOTE — Assessment & Plan Note (Signed)
Stable at this time.  Unfortunately did not have the humerus for him.  Discussed icing regimen and home exercises, discussed which activities to do and which ones to avoid.  Patient will continue to wear good shoes and recovery sandals at home.  Follow-up with me again in 6 to 8 weeks.

## 2022-07-02 NOTE — Assessment & Plan Note (Signed)
Patient is doing better overall.  Continues to strengthen overall.  Patient is continuing to monitor his weight.  I do believe the patient looks healthy overall and I think that this is contributing to why patient is doing better.  He does respond well to osteopathic manipulation.  Follow-up again 8 weeks

## 2022-07-12 ENCOUNTER — Encounter: Payer: Self-pay | Admitting: Family Medicine

## 2022-07-12 ENCOUNTER — Other Ambulatory Visit: Payer: Self-pay

## 2022-07-12 MED ORDER — PREDNISONE 50 MG PO TABS: ORAL_TABLET | ORAL | 0 refills | Status: DC

## 2022-07-12 MED ORDER — TIZANIDINE HCL 4 MG PO TABS: 4.0000 mg | ORAL_TABLET | Freq: Every day | ORAL | 0 refills | Status: DC

## 2022-07-25 ENCOUNTER — Ambulatory Visit (INDEPENDENT_AMBULATORY_CARE_PROVIDER_SITE_OTHER): Payer: BC Managed Care – PPO | Admitting: Family Medicine

## 2022-07-25 VITALS — BP 126/88 | HR 93 | Ht 72.0 in | Wt 326.0 lb

## 2022-07-25 DIAGNOSIS — M25571 Pain in right ankle and joints of right foot: Secondary | ICD-10-CM | POA: Diagnosis not present

## 2022-07-25 DIAGNOSIS — G8929 Other chronic pain: Secondary | ICD-10-CM

## 2022-07-25 DIAGNOSIS — M545 Low back pain, unspecified: Secondary | ICD-10-CM | POA: Diagnosis not present

## 2022-07-25 DIAGNOSIS — M7671 Peroneal tendinitis, right leg: Secondary | ICD-10-CM | POA: Diagnosis not present

## 2022-07-25 MED ORDER — METHYLPREDNISOLONE ACETATE 40 MG/ML IJ SUSP
40.0000 mg | Freq: Once | INTRAMUSCULAR | Status: AC
  Administered 2022-07-25: 40 mg via INTRAMUSCULAR

## 2022-07-25 MED ORDER — KETOROLAC TROMETHAMINE 60 MG/2ML IM SOLN
60.0000 mg | Freq: Once | INTRAMUSCULAR | Status: AC
  Administered 2022-07-25: 60 mg via INTRAMUSCULAR

## 2022-07-25 NOTE — Patient Instructions (Signed)
MRI lumbar and R ankle pain [phone] We will be in touch

## 2022-07-25 NOTE — Assessment & Plan Note (Addendum)
Worsening with now significant radicular symptoms as well as weakness of the lower extremities.  Likely deep tendon reflexes are intact.  Patient has done more than 12 weeks of formal physical therapy and is now having worsening neurologic symptoms.  Do feel advanced imaging is warranted.  Will get MRI for lumbar spine to see if anything is potentially causing impingement that could be contributing to the pain and patient could be a candidate for epidurals. Toradol and depomedrol given today as well IM Follow-up with me again after imaging to discuss further

## 2022-07-25 NOTE — Progress Notes (Signed)
[place] Phone: [phone] Subjective:   Shawn Shawn [name], am serving as a scribe for Dr. Hulan [name].  I'm seeing this patient by the request  of:  Shawn Salvage, FNP  CC: Back pain and foot pain  PHX:[id number]  Shawn Shawn [name] is a 32 y.o. male coming in with complaint of back and neck pain. OMT 07/02/2022. Patient states that he has been having mid back spasms. Taking muscle relaxer.   Tuesday his back was better but his ankle started to hurt more. Swollen at end of day. Prednisone did improve pain in ankle but not pain in his back.   Today the lower back pain has increased.   Medications patient has been prescribed: Prednisone Zanaflex  Taking:         Reviewed prior external information including notes and imaging from previsou exam, outside providers and external EMR if available.   As well as notes that were available from care everywhere and other healthcare systems.  Past medical history, social, surgical and family history all reviewed in electronic medical record.  Shawn [name] pertanent information unless stated regarding to the chief complaint.   Past Medical History:  Diagnosis Date   Migraine    Obesity    Strep throat     Allergies  Allergen Reactions   Other Anaphylaxis    Tree nuts   Nickel Rash    Topical allergy causes burning     Review of Systems:  Shawn [name] headache, visual changes, nausea, vomiting, diarrhea, constipation, dizziness, abdominal pain, skin rash, fevers, chills, night sweats, weight loss, swollen lymph nodes, body aches, joint swelling, chest pain, shortness of breath, mood changes. POSITIVE muscle aches  Objective  Blood pressure 126/88, pulse 93, height 6' (1.829 m), weight (!) 326 lb (147.9 kg), SpO2 98 %.   General: Shawn [name] apparent distress alert and oriented x3 mood and affect normal, dressed appropriately.  HEENT: Pupils equal, extraocular movements intact   Respiratory: Patient's speak in full sentences and does not appear short of breath  Significant swelling of the right ankle noted.  Significant overpronation noted of the right ankle to the patient does have some difficulty as well range of motion of the ankle secondary to pain.  Antalgic gait noted.  Back exam does have limited range of motion in both flexion and extension.  The patient has difficulty with FABER test secondary to pain as well.  Questionable straight leg test with radicular symptoms actually bilaterally was are significantly worse as well.  3 out of 5 strength of the lower extremities which is definitely different than patient's baseline.     Assessment and Plan:  Peroneal tendinitis of lower leg, right Patient's ankle has significantly more difficulty with swelling.  Now having difficulty with antalgic gait, has failed greater than 4 months of conservative therapy with continued to have discomfort and pain.  At this point I do feel advanced imaging is warranted with the other findings on x-ray that were abnormal.  Depending on findings we will see if possible injections or surgical intervention may be necessary.  Patient has failed orthotics, over-the-counter shoes, home exercises, formal physical therapy and oral anti-inflammatories.  Low back pain Worsening with now significant radicular symptoms as well as weakness of the lower extremities.  Likely deep tendon reflexes are intact.  Patient has done more than 12 weeks of formal physical therapy and is now having worsening neurologic symptoms.  Do feel advanced imaging is  warranted.  Will get MRI for lumbar spine to see if anything is potentially causing impingement that could be contributing to the pain and patient could be a candidate for epidurals.  Follow-up with me again after imaging to discuss further    Nonallopathic problems  Decision today to treat with OMT was based on Physical Exam  After verbal consent patient was  treated with HVLA, ME, FPR techniques in cervical, rib, thoracic, lumbar, and sacral  areas  Patient tolerated the procedure well with improvement in symptoms  Patient given exercises, stretches and lifestyle modifications  See medications in patient instructions if given  Patient will follow up in 4-8 weeks    The above documentation has been reviewed and is accurate and complete Lyndal Pulley, DO          Note: This dictation was prepared with Dragon dictation along with smaller phrase technology. Any transcriptional errors that result from this process are unintentional.

## 2022-07-25 NOTE — Assessment & Plan Note (Signed)
Patient's ankle has significantly more difficulty with swelling.  Now having difficulty with antalgic gait, has failed greater than 4 months of conservative therapy with continued to have discomfort and pain.  At this point I do feel advanced imaging is warranted with the other findings on x-ray that were abnormal.  Depending on findings we will see if possible injections or surgical intervention may be necessary.  Patient has failed orthotics, over-the-counter shoes, home exercises, formal physical therapy and oral anti-inflammatories.

## 2022-08-02 ENCOUNTER — Encounter: Payer: Self-pay | Admitting: Family Medicine

## 2022-08-08 ENCOUNTER — Other Ambulatory Visit: Payer: Self-pay | Admitting: Family Medicine

## 2022-08-10 ENCOUNTER — Ambulatory Visit
Admission: RE | Admit: 2022-08-10 | Discharge: 2022-08-10 | Disposition: A | Discharge status: Home / Self Care | Payer: No Typology Code available for payment source | Source: Ambulatory Visit | Attending: Family Medicine | Admitting: Family Medicine

## 2022-08-10 DIAGNOSIS — G8929 Other chronic pain: Secondary | ICD-10-CM

## 2022-08-10 DIAGNOSIS — M545 Low back pain, unspecified: Secondary | ICD-10-CM

## 2022-08-14 ENCOUNTER — Ambulatory Visit: Payer: Self-pay

## 2022-08-14 ENCOUNTER — Ambulatory Visit (INDEPENDENT_AMBULATORY_CARE_PROVIDER_SITE_OTHER): Payer: BC Managed Care – PPO | Admitting: Family Medicine

## 2022-08-14 VITALS — BP 138/110 | HR 86 | Ht 72.0 in | Wt 321.0 lb

## 2022-08-14 DIAGNOSIS — M25571 Pain in right ankle and joints of right foot: Secondary | ICD-10-CM | POA: Insufficient documentation

## 2022-08-14 DIAGNOSIS — S43492A Other sprain of left shoulder joint, initial encounter: Secondary | ICD-10-CM | POA: Diagnosis not present

## 2022-08-14 DIAGNOSIS — S43402A Unspecified sprain of left shoulder joint, initial encounter: Secondary | ICD-10-CM | POA: Insufficient documentation

## 2022-08-14 DIAGNOSIS — G8929 Other chronic pain: Secondary | ICD-10-CM

## 2022-08-14 NOTE — Assessment & Plan Note (Signed)
I believe that is more secondary to the [facility] aspect.  Discussed with patient about icing regimen and home exercises, which activities to do and which ones to avoid.  Increase activity slowly.  Follow-up again in 6 to 8 weeks

## 2022-08-14 NOTE — Progress Notes (Signed)
error 

## 2022-08-14 NOTE — Patient Instructions (Addendum)
Do prescribed exercises at least 3x a week Injection in Right ankle today See you again in 8 weeks Ice 20 min 2x a day Voltaren gel 2x a day

## 2022-08-14 NOTE — Progress Notes (Signed)
Shawn Gray Phone: [phone] Subjective:   Shawn Gray, am serving as a scribe for Dr. Hulan Saas.  I'm seeing this patient by the request  of:  Marrian Salvage, FNP  CC: Right ankle pain follow-up and new onset of left shoulder pain  ATF:[id number]  Shawn Gray is a 32 y.o. male coming in with complaint of R ankle pain. Patient states that his pain is more intense and constant. Pain over ankle mortise and into peroneal tendon. Patient fell one week ago and inverted ankle.   Pain in L shoulder with IR which is approving. Denies any radaiting symptoms. Pain in over bicep insertion. Fell on shoulder when he inverted ankle.      Past Medical History:  Diagnosis Date   Migraine    Obesity    Strep throat    Past Surgical History:  Procedure Laterality Date   MOUTH SURGERY     Social History   Socioeconomic History   Marital status: Married    Spouse name: Not on file   Number of children: Not on file   Years of education: Not on file   Highest education level: Not on file  Occupational History   Not on file  Tobacco Use   Smoking status: Never   Smokeless tobacco: Never  Vaping Use   Vaping Use: Never used  Substance and Sexual Activity   Alcohol use: Yes    Comment: occ   Drug use: Gray   Sexual activity: Not on file  Other Topics Concern   Not on file  Social History Narrative   Not on file   Social Determinants of Health   Financial Resource Strain: Not on file  Food Insecurity: Not on file  Transportation Needs: Not on file  Physical Activity: Not on file  Stress: Not on file  Social Connections: Not on file   Allergies  Allergen Reactions   Other Anaphylaxis    Tree nuts   Nickel Rash    Topical allergy causes burning   Family History  Problem Relation Age of Onset   Hypertension Mother    Diabetes Father    Heart disease Maternal Grandmother    Cancer  Maternal Grandfather    Diabetes Paternal Grandmother     Current Outpatient Medications (Endocrine & Metabolic):    predniSONE (DELTASONE) 50 MG tablet, Take one tablet daily for the next 5 days.    Current Outpatient Medications (Analgesics):    meloxicam (MOBIC) 15 MG tablet, TAKE 1 TABLET (15 MG TOTAL) BY MOUTH DAILY.   Current Outpatient Medications (Other):    cyclobenzaprine (FLEXERIL) 10 MG tablet, Take 1 tablet (10 mg total) by mouth 3 (three) times daily as needed for muscle spasms.   diazepam (VALIUM) 5 MG tablet, One tab by mouth, 2 hours before procedure.   Multiple Vitamin (MULTIVITAMIN WITH MINERALS) TABS tablet, Take 1 tablet by mouth daily.   Semaglutide-Weight Management (WEGOVY) 0.25 MG/0.5ML SOAJ, Inject 0.25 mg into the skin once a week.   tiZANidine (ZANAFLEX) 4 MG tablet, TAKE 1 TABLET BY MOUTH AT BEDTIME.   Vitamin D, Ergocalciferol, (DRISDOL) 1.25 MG (50000 UNIT) CAPS capsule, Take 1 capsule (50,000 Units total) by mouth every 7 (seven) days.   Reviewed prior external information including notes and imaging from  primary care provider As well as notes that were available from care everywhere and other healthcare systems.  Past medical history, social, surgical and family  history all reviewed in electronic medical record.  Gray pertanent information unless stated regarding to the chief complaint.   Review of Systems:  Gray headache, visual changes, nausea, vomiting, diarrhea, constipation, dizziness, abdominal pain, skin rash, fevers, chills, night sweats, weight loss, swollen lymph nodes, body aches, joint swelling, chest pain, shortness of breath, mood changes. POSITIVE muscle aches  Objective  Blood pressure (!) 138/110, pulse 86, height 6' (1.829 m), weight (!) 321 lb (145.6 kg), SpO2 99 %.   General: Gray apparent distress alert and oriented x3 mood and affect normal, dressed appropriately.  HEENT: Pupils equal, extraocular movements intact  Respiratory:  Patient's speak in full sentences and does not appear short of breath  Cardiovascular: Gray lower extremity edema, non tender, Gray erythema  Right ankle exam shows the patient does have swelling over the sinus tarsi and does have some pain with compression in the area.  Patient does have some limited range of motion also noted. Left shoulder for out of 5 strength noted but patient does have positive impingement with Neer and Hawkins.  Tender to palpation over the acromioclavicular joint.  Procedure: Real-time Ultrasound Guided Injection of right sinus tarsi Device: GE Logiq Q7 Ultrasound guided injection is preferred based studies that show increased duration, increased effect, greater accuracy, decreased procedural pain, increased response rate, and decreased cost with ultrasound guided versus blind injection.  Verbal informed consent obtained.  Time-out conducted.  Noted Gray overlying erythema, induration, or other signs of local infection.  Skin prepped in a sterile fashion.  Local anesthesia: Topical Ethyl chloride.  With sterile technique and under real time ultrasound guidance: With a 25-gauge half needle injected with 0.5 cc of 0.5% Marcaine and 0.5 cc of Kenalog 40 mg/mL Completed without difficulty  Pain immediately resolved suggesting accurate placement of the medication.  Advised to call if fevers/chills, erythema, induration, drainage, or persistent bleeding.  Impression: Technically successful ultrasound guided injection.   Impression and Recommendations:     The above documentation has been reviewed and is accurate and complete Lyndal Pulley, DO

## 2022-08-14 NOTE — Assessment & Plan Note (Signed)
Patient does have some hypoechoic changes noted.  This was also associated with what we saw on the MRI.  Discussed icing regimen and home exercises, discussed which activities to do injections to avoid, increase activity slowly over the course the next several weeks.  Follow-up again in 6 to 8 weeks to see how patient is responding and encouraged him to continue with the good shoes and orthotics.

## 2022-08-30 NOTE — Progress Notes (Unsigned)
Shawn Gray [place] [address] [place] Phone: [phone] Subjective:   Shawn Gray, am serving as a scribe for Dr. Hulan Saas.  I'm seeing this patient by the request  of:  Shawn Salvage, FNP  CC: Shoulder and ankle pain follow-up  [id number]  08/14/2022 I believe that is more secondary to the [facility] aspect. Discussed with patient about icing regimen and home exercises, which activities to do and which ones to avoid. Increase activity slowly. Follow-up again in 6 to 8 weeks   Patient does have some hypoechoic changes noted. This was also associated with what we saw on the MRI. Discussed icing regimen and home exercises, discussed which activities to do injections to avoid, increase activity slowly over the course the next several weeks. Follow-up again in 6 to 8 weeks to see how patient is responding and encouraged him to continue with the good shoes and orthotics.   Updated 09/04/2022 Shawn Gray is a 32 y.o. male coming in with complaint of shoulder and ankle pain. Shoulders are doing fine. Ankle was pain free until Friday. More discomfort sitting than standing. Standing desk has helped with the back.       Past Medical History:  Diagnosis Date   Migraine    Obesity    Strep throat    Past Surgical History:  Procedure Laterality Date   MOUTH SURGERY     Social History   Socioeconomic History   Marital status: Married    Spouse name: Not on file   Number of children: Not on file   Years of education: Not on file   Highest education level: Not on file  Occupational History   Not on file  Tobacco Use   Smoking status: Never   Smokeless tobacco: Never  Vaping Use   Vaping Use: Never used  Substance and Sexual Activity   Alcohol use: Yes    Comment: occ   Drug use: No   Sexual activity: Not on file  Other Topics Concern   Not on file  Social History Narrative   Not on file   Social Determinants of Health    Financial Resource Strain: Not on file  Food Insecurity: Not on file  Transportation Needs: Not on file  Physical Activity: Not on file  Stress: Not on file  Social Connections: Not on file   Allergies  Allergen Reactions   Other Anaphylaxis    Tree nuts   Nickel Rash    Topical allergy causes burning   Family History  Problem Relation Age of Onset   Hypertension Mother    Diabetes Father    Heart disease Maternal Grandmother    Cancer Maternal Grandfather    Diabetes Paternal Grandmother     Current Outpatient Medications (Endocrine & Metabolic):    predniSONE (DELTASONE) 50 MG tablet, Take one tablet daily for the next 5 days.    Current Outpatient Medications (Analgesics):    meloxicam (MOBIC) 15 MG tablet, TAKE 1 TABLET (15 MG TOTAL) BY MOUTH DAILY.   Current Outpatient Medications (Other):    cyclobenzaprine (FLEXERIL) 10 MG tablet, Take 1 tablet (10 mg total) by mouth 3 (three) times daily as needed for muscle spasms.   diazepam (VALIUM) 5 MG tablet, One tab by mouth, 2 hours before procedure.   Multiple Vitamin (MULTIVITAMIN WITH MINERALS) TABS tablet, Take 1 tablet by mouth daily.   Semaglutide-Weight Management (WEGOVY) 0.25 MG/0.5ML SOAJ, Inject 0.25 mg into the skin once a  week.   tiZANidine (ZANAFLEX) 4 MG tablet, TAKE 1 TABLET BY MOUTH AT BEDTIME.   Vitamin D, Ergocalciferol, (DRISDOL) 1.25 MG (50000 UNIT) CAPS capsule, Take 1 capsule (50,000 Units total) by mouth every 7 (seven) days.   Reviewed prior external information including notes and imaging from  primary care provider As well as notes that were available from care everywhere and other healthcare systems.  Past medical history, social, surgical and family history all reviewed in electronic medical record.  No pertanent information unless stated regarding to the chief complaint.   Review of Systems:  No headache, visual changes, nausea, vomiting, diarrhea, constipation, dizziness, abdominal  pain, skin rash, fevers, chills, night sweats, weight loss, swollen lymph nodes, body aches, joint swelling, chest pain, shortness of breath, mood changes. POSITIVE muscle aches  Objective  Blood pressure 132/84, pulse 98, height 5' 11"$  (1.803 m), weight (!) 324 lb (147 kg), SpO2 98 %.   General: No apparent distress alert and oriented x3 mood and affect normal, dressed appropriately.  HEENT: Pupils equal, extraocular movements intact  Respiratory: Patient's speak in full sentences and does not appear short of breath  Cardiovascular: No lower extremity edema, non tender, no erythema  Patient's ankle does have some improvement in range of motion noted.  Not as much swelling noted.  More tenderness noted noted over the ankle mortise than previous exam.  Limited muscular skeletal ultrasound was performed and interpreted by Hulan Saas, M  Significant decrease in the hypoechoic changes of the ankle joint and the sinus tarsi from previous exam.  Patient does have some hypoechoic changes in the anterior tibialis. Impression: Improvement in ankle but anterior tibialis tendinitis    Impression and Recommendations:    The above documentation has been reviewed and is accurate and complete Lyndal Pulley, DO

## 2022-09-04 ENCOUNTER — Encounter (INDEPENDENT_AMBULATORY_CARE_PROVIDER_SITE_OTHER): Payer: Self-pay | Admitting: Family Medicine

## 2022-09-04 ENCOUNTER — Ambulatory Visit (INDEPENDENT_AMBULATORY_CARE_PROVIDER_SITE_OTHER): Payer: BC Managed Care – PPO | Admitting: Family Medicine

## 2022-09-04 ENCOUNTER — Ambulatory Visit: Payer: Self-pay

## 2022-09-04 ENCOUNTER — Encounter: Payer: Self-pay | Admitting: Family Medicine

## 2022-09-04 VITALS — BP 168/122 | HR 86 | Temp 98.5°F | Ht 71.0 in | Wt 319.0 lb

## 2022-09-04 VITALS — BP 132/84 | HR 98 | Ht 71.0 in | Wt 324.0 lb

## 2022-09-04 DIAGNOSIS — M25571 Pain in right ankle and joints of right foot: Secondary | ICD-10-CM | POA: Diagnosis not present

## 2022-09-04 DIAGNOSIS — R7303 Prediabetes: Secondary | ICD-10-CM | POA: Diagnosis not present

## 2022-09-04 DIAGNOSIS — E65 Localized adiposity: Secondary | ICD-10-CM | POA: Insufficient documentation

## 2022-09-04 DIAGNOSIS — Z6841 Body Mass Index (BMI) 40.0 and over, adult: Secondary | ICD-10-CM

## 2022-09-04 DIAGNOSIS — R030 Elevated blood-pressure reading, without diagnosis of hypertension: Secondary | ICD-10-CM | POA: Diagnosis not present

## 2022-09-04 DIAGNOSIS — M7671 Peroneal tendinitis, right leg: Secondary | ICD-10-CM | POA: Diagnosis not present

## 2022-09-04 DIAGNOSIS — Z0289 Encounter for other administrative examinations: Secondary | ICD-10-CM

## 2022-09-04 HISTORY — DX: Prediabetes: R73.03

## 2022-09-04 HISTORY — DX: Morbid (severe) obesity due to excess calories: E66.01

## 2022-09-04 NOTE — Patient Instructions (Signed)
Good to see you! [facility] Keep working pushing front toe down when walking See you again in 8-10 weeks

## 2022-09-04 NOTE — Assessment & Plan Note (Signed)
High visceral fat rating on bioimpedence at 22 with a goal <10 Explained that this puts him high risk for heart disease, diabetes and fatty liver.  Continue active plan for weight reduction

## 2022-09-04 NOTE — Assessment & Plan Note (Signed)
Lab Results  Component Value Date   HGBA1C 6.2 03/27/2022    At risk for type II diabetes with + fam hx of T2DM, current A1c of 6.2 and BMI 44.  Has started to reduce intake of added sugar, added in gym workouts several days a week and is using intermittent fasting to help with insulin sensitivity.

## 2022-09-04 NOTE — Assessment & Plan Note (Addendum)
Keeping a log of BP's Not on any blood pressure meds Has never had a sleep study Denies daytime somnolence  Consider treatment if BP continues to run > 140/90

## 2022-09-04 NOTE — Progress Notes (Signed)
Office: [phone]  /  Fax: [phone]   Initial Visit  Shawn Gray was seen in clinic today to evaluate for obesity. He is interested in losing weight to improve overall health and reduce the risk of weight related complications. He presents today to review program treatment options, initial physical assessment, and evaluation.     He was referred by: PCP  Weight history: weight up and down Lost to 275 on keto diet in 2020 Peak weight 336 lb 2 mos ago Has started intermittent fasting  When asked what else they would like to accomplish? He states: Improve energy levels and physical activity and Improve existing medical conditions  When asked how has your weight affected you? He states: Contributed to medical problems  Some associated conditions: Hypertension and Prediabetes  Contributing factors: Family history, Nutritional, Stress, and Other: working long hours in surgical billing  Weight promoting medications identified: None  Current nutrition plan: Low-carb and Other: and intermittent fasting 16:8  Current level of physical activity: Strength training and Other: HIIT training  Current or previous pharmacotherapy: None  Response to medication: Never tried medications   Past medical history includes:   Past Medical History:  Diagnosis Date   Migraine    Obesity    Strep throat      Objective:   BP (!) 168/122   Pulse 86   Temp 98.5 F (36.9 C)   Ht 5' 11"$  (1.803 m)   Wt (!) 319 lb (144.7 kg)   SpO2 99%   BMI 44.49 kg/m  He was weighed on the bioimpedance scale: Body mass index is 44.49 kg/m.  Peak Weight:319 lb , Body Fat%:38.1, Visceral Fat Rating:22, Weight trend over the last 12 months: Decreasing  General:  Alert, oriented and cooperative. Patient is in no acute distress.  Respiratory: Normal respiratory effort, no problems with respiration noted  Extremities: Normal range of motion.    Mental Status: Normal mood and affect. Normal behavior.  Normal judgment and thought content.   DIAGNOSTIC DATA REVIEWED:  BMET    Component Value Date/Time   NA 136 06/08/2022 1903   K 3.3 (L) 06/08/2022 1903   CL 102 06/08/2022 1903   CO2 22 06/08/2022 1903   GLUCOSE 139 (H) 06/08/2022 1903   BUN 10 06/08/2022 1903   CREATININE 1.32 (H) 06/08/2022 1903   CALCIUM 8.9 06/08/2022 1903   GFRNONAA >60 06/08/2022 1903   GFRAA >60 06/26/2018 1732   Lab Results  Component Value Date   HGBA1C 6.2 03/27/2022   No results found for: "INSULIN" CBC    Component Value Date/Time   WBC 7.4 06/08/2022 1903   RBC 5.29 06/08/2022 1903   HGB 14.2 06/08/2022 1903   HCT 42.5 06/08/2022 1903   PLT 287 06/08/2022 1903   MCV 80.3 06/08/2022 1903   MCH 26.8 06/08/2022 1903   MCHC 33.4 06/08/2022 1903   RDW 14.3 06/08/2022 1903   Iron/TIBC/Ferritin/ %Sat No results found for: "IRON", "TIBC", "FERRITIN", "IRONPCTSAT" Lipid Panel     Component Value Date/Time   CHOL 154 02/24/2021 0900   TRIG 78.0 02/24/2021 0900   HDL 47.20 02/24/2021 0900   CHOLHDL 3 02/24/2021 0900   VLDL 15.6 02/24/2021 0900   LDLCALC 91 02/24/2021 0900   Hepatic Function Panel     Component Value Date/Time   PROT 7.4 05/15/2022 0919   ALBUMIN 4.2 05/15/2022 0919   AST 20 05/15/2022 0919   ALT 30 05/15/2022 0919   ALKPHOS 91 05/15/2022 0919   BILITOT  0.6 05/15/2022 0919      Component Value Date/Time   TSH 2.35 02/24/2021 0900     Assessment and Plan:   Elevated blood pressure reading Assessment & Plan: Keeping a log of BP's Not on any blood pressure meds Has never had a sleep study Denies daytime somnolence  Consider treatment if BP continues to run > 140/90   Morbid obesity (HCC)  BMI 40.0-44.9, adult (HCC)  Prediabetes Assessment & Plan: Lab Results  Component Value Date   HGBA1C 6.2 03/27/2022    At risk for type II diabetes with + fam hx of T2DM, current A1c of 6.2 and BMI 44.  Has started to reduce intake of added sugar, added in gym  workouts several days a week and is using intermittent fasting to help with insulin sensitivity.   Central adiposity Assessment & Plan: High visceral fat rating on bioimpedence at 22 with a goal <10 Explained that this puts him high risk for heart disease, diabetes and fatty liver.  Continue active plan for weight reduction         Obesity Treatment / Action Plan:  Patient will work on garnering support from family and friends to begin weight loss journey. Will work on eliminating or reducing the presence of highly palatable, calorie dense foods in the home. Will complete provided nutritional and psychosocial assessment questionnaire before the next appointment. Will be scheduled for indirect calorimetry to determine resting energy expenditure in a fasting state.  This will allow [place] to create a reduced calorie, high-protein meal plan to promote loss of fat mass while preserving muscle mass. Was counseled on nutritional approaches to weight loss and benefits of complex carbs and high quality protein as part of nutritional weight management. Was counseled on pharmacotherapy and role as an adjunct in weight management.   Obesity Education Performed Today:  He was weighed on the bioimpedance scale and results were discussed and documented in the synopsis.  We discussed obesity as a disease and the importance of a more detailed evaluation of all the factors contributing to the disease.  We discussed the importance of long term lifestyle changes which include nutrition, exercise and behavioral modifications as well as the importance of customizing this to his specific health and social needs.  We discussed the benefits of reaching a healthier weight to alleviate the symptoms of existing conditions and reduce the risks of the biomechanical, metabolic and psychological effects of obesity.  [place] Eade appears to be in the action stage of change and states they are ready to start intensive  lifestyle modifications and behavioral modifications.  30 minutes was spent today on this visit including the above counseling, pre-visit chart review, and post-visit documentation.  Reviewed by clinician on day of visit: allergies, medications, problem list, medical history, surgical history, family history, social history, and previous encounter notes.    Loyal Gambler DO

## 2022-09-04 NOTE — Assessment & Plan Note (Signed)
Patient is responded very well, will consider the possibility of osteopathic manipulation for some of the back pain and other things.  Discussed icing regimen and home exercises, which activities to do and which ones to avoid.  We discussed proper shoes and over-the-counter orthotics still.  Follow-up again with me 6 to 8 weeks.  We did make changes to his shoes to help with the anterior BLS tendinitis.  Will follow-up again in the near future for other problems.

## 2022-09-10 ENCOUNTER — Encounter: Payer: Self-pay | Admitting: Family Medicine

## 2022-10-04 NOTE — Progress Notes (Signed)
[place] Phone: [phone] Subjective:   [name], am serving as a scribe for Dr. Hulan Saas.  I'm seeing this patient by the request  of:  Marrian Salvage, FNP  CC: Foot pain, back pain follow-up  [id number]  09/04/2022 Patient is responded very well, will consider the possibility of osteopathic manipulation for some of the back pain and other things.  Discussed icing regimen and home exercises, which activities to do and which ones to avoid.  We discussed proper shoes and over-the-counter orthotics still.  Follow-up again with me 6 to 8 weeks.  We did make changes to his shoes to help with the anterior BLS tendinitis.  Will follow-up again in the near future for other problems.       Update 10/16/2022 Ekam Derstine is a 32 y.o. male coming in with complaint of R foot pain. Patient states that he is doing better. Has pain over the top of the foot that seems to be intermittent.   Thoracic and lumbar spine pain has increased or he feels that he is noticing it more recenlty due to ankle feeling better.        Past Medical History:  Diagnosis Date   Migraine    Obesity    Strep throat    Past Surgical History:  Procedure Laterality Date   MOUTH SURGERY     Social History   Socioeconomic History   Marital status: Married    Spouse name: Not on file   Number of children: Not on file   Years of education: Not on file   Highest education level: Not on file  Occupational History   Not on file  Tobacco Use   Smoking status: Never   Smokeless tobacco: Never  Vaping Use   Vaping Use: Never used  Substance and Sexual Activity   Alcohol use: Yes    Comment: occ   Drug use: No   Sexual activity: Not on file  Other Topics Concern   Not on file  Social History Narrative   Not on file   Social Determinants of Health   Financial Resource Strain: Not on file  Food Insecurity: Not on  file  Transportation Needs: Not on file  Physical Activity: Not on file  Stress: Not on file  Social Connections: Not on file   Allergies  Allergen Reactions   Other Anaphylaxis    Tree nuts   Nickel Rash    Topical allergy causes burning   Family History  Problem Relation Age of Onset   Hypertension Mother    Diabetes Father    Heart disease Maternal Grandmother    Cancer Maternal Grandfather    Diabetes Paternal Grandmother     Current Outpatient Medications (Endocrine & Metabolic):    predniSONE (DELTASONE) 50 MG tablet, Take one tablet daily for the next 5 days.    Current Outpatient Medications (Analgesics):    meloxicam (MOBIC) 15 MG tablet, TAKE 1 TABLET (15 MG TOTAL) BY MOUTH DAILY.   Current Outpatient Medications (Other):    cyclobenzaprine (FLEXERIL) 10 MG tablet, Take 1 tablet (10 mg total) by mouth 3 (three) times daily as needed for muscle spasms.   diazepam (VALIUM) 5 MG tablet, One tab by mouth, 2 hours before procedure.   Multiple Vitamin (MULTIVITAMIN WITH MINERALS) TABS tablet, Take 1 tablet by mouth daily.   Semaglutide-Weight Management (WEGOVY) 0.25 MG/0.5ML SOAJ, Inject 0.25 mg into the skin once  a week.   tiZANidine (ZANAFLEX) 4 MG tablet, TAKE 1 TABLET BY MOUTH AT BEDTIME.   Vitamin D, Ergocalciferol, (DRISDOL) 1.25 MG (50000 UNIT) CAPS capsule, Take 1 capsule (50,000 Units total) by mouth every 7 (seven) days.   Reviewed prior external information including notes and imaging from  primary care provider As well as notes that were available from care everywhere and other healthcare systems.  Past medical history, social, surgical and family history all reviewed in electronic medical record.  No pertanent information unless stated regarding to the chief complaint.   Review of Systems:  No headache, visual changes, nausea, vomiting, diarrhea, constipation, dizziness, abdominal pain, skin rash, fevers, chills, night sweats, weight loss, swollen  lymph nodes, body aches, joint swelling, chest pain, shortness of breath, mood changes. POSITIVE muscle aches  Objective  Blood pressure 110/82, pulse 72, height 5\' 11"  (1.803 m), weight (!) 322 lb (146.1 kg), SpO2 98 %.   General: No apparent distress alert and oriented x3 mood and affect normal, dressed appropriately.  HEENT: Pupils equal, extraocular movements intact  Respiratory: Patient's speak in full sentences and does not appear short of breath  Cardiovascular: No lower extremity edema, non tender, no erythema  Foot exam appears fairly unremarkable. Low back exam does have some loss of lordosis noted.  Some tenderness to palpation in the paraspinal musculature.  Seems to be tender in the right paraspinal area.  Osteopathic finding C2 flexed rotated and side bent right T6 extended rotated and side bent right inhaled third rib L1 flexed rotated and side bent right Sacrum right on right     Impression and Recommendations:    The above documentation has been reviewed and is accurate and complete Lyndal Pulley, DO

## 2022-10-16 ENCOUNTER — Encounter: Payer: Self-pay | Admitting: Family Medicine

## 2022-10-16 ENCOUNTER — Other Ambulatory Visit: Payer: Self-pay

## 2022-10-16 ENCOUNTER — Ambulatory Visit (INDEPENDENT_AMBULATORY_CARE_PROVIDER_SITE_OTHER): Payer: BC Managed Care – PPO | Admitting: Family Medicine

## 2022-10-16 VITALS — BP 110/82 | HR 72 | Ht 71.0 in | Wt 322.0 lb

## 2022-10-16 DIAGNOSIS — M999 Biomechanical lesion, unspecified: Secondary | ICD-10-CM | POA: Diagnosis not present

## 2022-10-16 DIAGNOSIS — M545 Low back pain, unspecified: Secondary | ICD-10-CM

## 2022-10-16 DIAGNOSIS — M79671 Pain in right foot: Secondary | ICD-10-CM

## 2022-10-16 DIAGNOSIS — G8929 Other chronic pain: Secondary | ICD-10-CM

## 2022-10-16 DIAGNOSIS — M9902 Segmental and somatic dysfunction of thoracic region: Secondary | ICD-10-CM | POA: Diagnosis not present

## 2022-10-16 DIAGNOSIS — M9904 Segmental and somatic dysfunction of sacral region: Secondary | ICD-10-CM | POA: Diagnosis not present

## 2022-10-16 DIAGNOSIS — M9903 Segmental and somatic dysfunction of lumbar region: Secondary | ICD-10-CM | POA: Diagnosis not present

## 2022-10-16 DIAGNOSIS — M9901 Segmental and somatic dysfunction of cervical region: Secondary | ICD-10-CM

## 2022-10-16 DIAGNOSIS — M25571 Pain in right ankle and joints of right foot: Secondary | ICD-10-CM

## 2022-10-16 DIAGNOSIS — M9908 Segmental and somatic dysfunction of rib cage: Secondary | ICD-10-CM

## 2022-10-16 NOTE — Assessment & Plan Note (Signed)
Multifactorial.  Continue to work on weight loss.  Continue to work on core strengthening.  Patient has lost weight since we have seen him last.  Patient has not been quite as active though.  Working on getting patient's BMI under 40 at least which I think would be beneficial and 35 about ideal.  Increase activity slowly otherwise.  Follow-up with me again in 8 weeks.

## 2022-10-16 NOTE — Assessment & Plan Note (Signed)

## 2022-10-16 NOTE — Assessment & Plan Note (Signed)
Significant improvement at this time. 

## 2022-10-16 NOTE — Patient Instructions (Signed)
Good to see you Keep watching the ankle Look into yoga wheel See me in 8-10 weeks

## 2022-10-17 ENCOUNTER — Encounter (INDEPENDENT_AMBULATORY_CARE_PROVIDER_SITE_OTHER): Payer: Self-pay | Admitting: Family Medicine

## 2022-10-17 ENCOUNTER — Ambulatory Visit (INDEPENDENT_AMBULATORY_CARE_PROVIDER_SITE_OTHER): Payer: BC Managed Care – PPO | Admitting: Family Medicine

## 2022-10-17 VITALS — BP 148/94 | HR 85 | Temp 97.6°F | Ht 71.0 in | Wt 317.0 lb

## 2022-10-17 DIAGNOSIS — Z6841 Body Mass Index (BMI) 40.0 and over, adult: Secondary | ICD-10-CM

## 2022-10-17 DIAGNOSIS — F3289 Other specified depressive episodes: Secondary | ICD-10-CM

## 2022-10-17 DIAGNOSIS — R5383 Other fatigue: Secondary | ICD-10-CM | POA: Diagnosis not present

## 2022-10-17 DIAGNOSIS — E669 Obesity, unspecified: Secondary | ICD-10-CM

## 2022-10-17 DIAGNOSIS — E559 Vitamin D deficiency, unspecified: Secondary | ICD-10-CM

## 2022-10-17 DIAGNOSIS — R0602 Shortness of breath: Secondary | ICD-10-CM

## 2022-10-17 DIAGNOSIS — Z1331 Encounter for screening for depression: Secondary | ICD-10-CM

## 2022-10-17 DIAGNOSIS — I10 Essential (primary) hypertension: Secondary | ICD-10-CM | POA: Diagnosis not present

## 2022-10-17 DIAGNOSIS — R7303 Prediabetes: Secondary | ICD-10-CM

## 2022-10-17 DIAGNOSIS — G4730 Sleep apnea, unspecified: Secondary | ICD-10-CM

## 2022-10-17 NOTE — Progress Notes (Signed)
Chief Complaint:   OBESITY Shawn Gray (MR# [id number]) is a 32 y.o. male who presents for evaluation and treatment of obesity and related comorbidities. Current BMI is Body mass index is 44.21 kg/m. Shawn Gray has been struggling with his weight for many years and has been unsuccessful in either losing weight, maintaining weight loss, or reaching his healthy weight goal.  Works a sedentary job as a Dentist.  He lives with his wife and 12 year old child.  Has been on a keto diet and has tried intermittent fasting.  He eats out 5 meals per week, he skips breakfast.  Drinks SSB's daily.   Shawn Gray is currently in the action stage of change and ready to dedicate time achieving and maintaining a healthier weight. Shawn Gray is interested in becoming our patient and working on intensive lifestyle modifications including (but not limited to) diet and exercise for weight loss.  Shawn Gray's habits were reviewed today and are as follows: His family eats meals together, he thinks his family will eat healthier with him, his desired weight loss is 107 lbs, he has been heavy most of his life, he started gaining weight after marriage, his heaviest weight ever was 336 pounds, he has significant food cravings issues, he snacks frequently in the evenings, he wakes up frequently in the middle of the night to eat, he skips meals frequently, he is frequently drinking liquids with calories, he frequently makes poor food choices, he frequently eats larger portions than normal, and he struggles with emotional eating.  Depression Screen Shawn Gray's Food and Mood (modified PHQ-9) score was 21.  Subjective:   1. Other fatigue Shawn Gray admits to daytime somnolence and admits to waking up still tired. Patient has a history of symptoms of daytime fatigue, morning fatigue, and morning headache. Shawn Gray generally gets 5 hours of sleep per night, and states that he has nightime awakenings. Snoring is present. Apneic episodes are  present. Epworth Sleepiness Score is 4.   2. SOBOE (shortness of breath on exertion) Shawn Gray notes increasing shortness of breath with exercising and seems to be worsening over time with weight gain. He notes getting out of breath sooner with activity than he used to. This has not gotten worse recently. Shawn Gray denies shortness of breath at rest or orthopnea.  3. Essential hypertension Blood pressure is elevated today.  Currently not taking any anti-hypertensive medications.  Occasional headaches in the AM.  Has never had a sleep study.     4. Vitamin D deficiency Patient is taking prescription Vitamin D 50,000 IU weekly, ran out in January.  Energy level is low.   5. Prediabetes Last A1c was 6.2, Sept. 2023. He has never used metformin.  He has a positive family history of T2DM.  Patient craves starches and sweets.   6. Sleep-disordered breathing Patient gets inadequate sleep at night, snoring and daytime fatigue.  7. Other depression, with emotional eating Bariatric PHQ-9:21 Patient has been stressed eating due to work.  Plans to start a new job soon.  Assessment/Plan:   1. Other fatigue Shawn Gray does feel that his weight is causing his energy to be lower than it should be. Fatigue may be related to obesity, depression or many other causes. Labs will be ordered, and in the meanwhile, Shawn Gray will focus on self care including making healthy food choices, increasing physical activity and focusing on stress reduction.  - Vitamin B12 - CBC with Differential/Platelet - Comprehensive metabolic panel - Folate - Hemoglobin A1c - Insulin, random -  Lipid Panel With LDL/HDL Ratio - VITAMIN D 25 Hydroxy (Vit-D Deficiency, Fractures) - TSH - T4, free  2. SOBOE (shortness of breath on exertion) Shawn Gray does feel that he gets out of breath more easily that he used to when he exercises. Shawn Gray shortness of breath appears to be obesity related and exercise induced. He has agreed to work on weight loss  and gradually increase exercise to treat his exercise induced shortness of breath. Will continue to monitor closely.  3. Essential hypertension Monitor blood pressures with changing jobs.   4. Vitamin D deficiency Recheck Vitamin D level today with a goal of 50-70.  5. Prediabetes Check fasting insulin and A1c today.  6. Sleep-disordered breathing Referral to sleep medicine.  Referral- Ambulatory referral to Neurology  7. Other depression, with emotional eating Consider use of Wellbutrin, work on stress reduction.  8. Depression screening Shawn Gray had a positive depression screening. Depression is commonly associated with obesity and often results in emotional eating behaviors. We will monitor this closely and work on CBT to help improve the non-hunger eating patterns. Referral to Psychology may be required if no improvement is seen as he continues in our clinic.  9. BMI 40.0-44.9, adult  10. Obesity with starting BMI of 44.2 Shawn Gray is currently in the action stage of change and his goal is to continue with weight loss efforts. I recommend Shawn Gray begin the structured treatment plan as follows:  He has agreed to the Category 4 Plan  Exercise goals:  track steps.    Behavioral modification strategies: increasing lean protein intake, increasing water intake, decreasing eating out, no skipping meals, meal planning and cooking strategies, keeping healthy foods in the home, better snacking choices, planning for success, and decreasing junk food.  He was informed of the importance of frequent follow-up visits to maximize his success with intensive lifestyle modifications for his multiple health conditions. He was informed we would discuss his lab results at his next visit unless there is a critical issue that needs to be addressed sooner. Shawn Gray agreed to keep his next visit at the agreed upon time to discuss these results.  Objective:   Blood pressure (!) 148/94, pulse 85, temperature 97.6  F (36.4 C), height 5\' 11"  (1.803 m), weight (!) 317 lb (143.8 kg), SpO2 99 %. Body mass index is 44.21 kg/m.  EKG: Normal sinus rhythm, rate 98 bpm.  Indirect Calorimeter completed today shows a VO2 of 398 and a REE of 2750.  His calculated basal metabolic rate is 2778 thus his basal metabolic rate is worse than expected.  General: Cooperative, alert, well developed, in no acute distress. HEENT: Conjunctivae and lids unremarkable. Cardiovascular: Regular rhythm.  Lungs: Normal work of breathing. Neurologic: No focal deficits.   Lab Results  Component Value Date   CREATININE 1.32 (H) 06/08/2022   BUN 10 06/08/2022   NA 136 06/08/2022   K 3.3 (L) 06/08/2022   CL 102 06/08/2022   CO2 22 06/08/2022   Lab Results  Component Value Date   ALT 30 05/15/2022   AST 20 05/15/2022   ALKPHOS 91 05/15/2022   BILITOT 0.6 05/15/2022   Lab Results  Component Value Date   HGBA1C 6.2 03/27/2022   No results found for: "INSULIN" Lab Results  Component Value Date   TSH 2.35 02/24/2021   Lab Results  Component Value Date   CHOL 154 02/24/2021   HDL 47.20 02/24/2021   LDLCALC 91 02/24/2021   TRIG 78.0 02/24/2021   CHOLHDL 3 02/24/2021  Lab Results  Component Value Date   WBC 7.4 06/08/2022   HGB 14.2 06/08/2022   HCT 42.5 06/08/2022   MCV 80.3 06/08/2022   PLT 287 06/08/2022   No results found for: "IRON", "TIBC", "FERRITIN"  Attestation Statements:   Reviewed by clinician on day of visit: allergies, medications, problem list, medical history, surgical history, family history, social history, and previous encounter notes.  I have personally spent 40 minutes total time today in preparation, patient care, nutritional counseling and documentation for this visit, including the following: review of clinical lab tests; review of medical tests/procedures/services.   I, Malcolm Metro, am acting as Energy manager for Seymour Bars, DO.  I have reviewed the above documentation for  accuracy and completeness, and I agree with the above. Glennis Brink, DO

## 2022-10-18 LAB — COMPREHENSIVE METABOLIC PANEL
ALT: 23 IU/L (ref 0–44)
AST: 16 IU/L (ref 0–40)
Albumin/Globulin Ratio: 1.5 (ref 1.2–2.2)
Albumin: 4.4 g/dL (ref 4.1–5.1)
Alkaline Phosphatase: 118 IU/L (ref 44–121)
BUN/Creatinine Ratio: 10 (ref 9–20)
BUN: 10 mg/dL (ref 6–20)
Bilirubin Total: 0.3 mg/dL (ref 0.0–1.2)
CO2: 19 mmol/L — ABNORMAL LOW (ref 20–29)
Calcium: 9.5 mg/dL (ref 8.7–10.2)
Chloride: 104 mmol/L (ref 96–106)
Creatinine, Ser: 1.04 mg/dL (ref 0.76–1.27)
Globulin, Total: 2.9 g/dL (ref 1.5–4.5)
Glucose: 100 mg/dL — ABNORMAL HIGH (ref 70–99)
Potassium: 4.3 mmol/L (ref 3.5–5.2)
Sodium: 140 mmol/L (ref 134–144)
Total Protein: 7.3 g/dL (ref 6.0–8.5)
eGFR: 98 mL/min/{1.73_m2} (ref >59)

## 2022-10-18 LAB — VITAMIN B12: Vitamin B-12: 148 pg/mL — ABNORMAL LOW (ref 232–1245)

## 2022-10-18 LAB — CBC WITH DIFFERENTIAL/PLATELET
Basophils Absolute: 0 10*3/uL (ref 0.0–0.2)
Basos: 0 %
EOS (ABSOLUTE): 0.1 10*3/uL (ref 0.0–0.4)
Eos: 2 %
Hematocrit: 47.8 % (ref 37.5–51.0)
Hemoglobin: 15.2 g/dL (ref 13.0–17.7)
Immature Grans (Abs): 0 10*3/uL (ref 0.0–0.1)
Immature Granulocytes: 0 %
Lymphocytes Absolute: 1.8 10*3/uL (ref 0.7–3.1)
Lymphs: 25 %
MCH: 27.2 pg (ref 26.6–33.0)
MCHC: 31.8 g/dL (ref 31.5–35.7)
MCV: 86 fL (ref 79–97)
Monocytes Absolute: 0.5 10*3/uL (ref 0.1–0.9)
Monocytes: 6 %
Neutrophils Absolute: 4.7 10*3/uL (ref 1.4–7.0)
Neutrophils: 67 %
Platelets: 314 10*3/uL (ref 150–450)
RBC: 5.58 x10E6/uL (ref 4.14–5.80)
RDW: 14.2 % (ref 11.6–15.4)
WBC: 7.1 10*3/uL (ref 3.4–10.8)

## 2022-10-18 LAB — LIPID PANEL WITH LDL/HDL RATIO
Cholesterol, Total: 179 mg/dL (ref 100–199)
HDL: 48 mg/dL
LDL Chol Calc (NIH): 120 mg/dL — ABNORMAL HIGH (ref 0–99)
LDL/HDL Ratio: 2.5 ratio (ref 0.0–3.6)
Triglycerides: 56 mg/dL (ref 0–149)
VLDL Cholesterol Cal: 11 mg/dL (ref 5–40)

## 2022-10-18 LAB — FOLATE: Folate: 5.3 ng/mL

## 2022-10-18 LAB — HEMOGLOBIN A1C
Est. average glucose Bld gHb Est-mCnc: 128 mg/dL
Hgb A1c MFr Bld: 6.1 % — ABNORMAL HIGH (ref 4.8–5.6)

## 2022-10-18 LAB — INSULIN, RANDOM: INSULIN: 29.5 u[IU]/mL — ABNORMAL HIGH (ref 2.6–24.9)

## 2022-10-18 LAB — TSH: TSH: 1.89 u[IU]/mL (ref 0.450–4.500)

## 2022-10-18 LAB — VITAMIN D 25 HYDROXY (VIT D DEFICIENCY, FRACTURES): Vit D, 25-Hydroxy: 18.2 ng/mL — ABNORMAL LOW (ref 30.0–100.0)

## 2022-10-18 LAB — T4, FREE: Free T4: 1.23 ng/dL (ref 0.82–1.77)

## 2022-10-31 ENCOUNTER — Ambulatory Visit (INDEPENDENT_AMBULATORY_CARE_PROVIDER_SITE_OTHER): Payer: BC Managed Care – PPO | Admitting: Family Medicine

## 2022-11-07 ENCOUNTER — Encounter: Payer: Self-pay | Admitting: Family Medicine

## 2022-11-13 ENCOUNTER — Encounter: Payer: Self-pay | Admitting: Family Medicine

## 2022-11-19 ENCOUNTER — Encounter (INDEPENDENT_AMBULATORY_CARE_PROVIDER_SITE_OTHER): Payer: Self-pay | Admitting: Family Medicine

## 2022-11-19 ENCOUNTER — Ambulatory Visit (INDEPENDENT_AMBULATORY_CARE_PROVIDER_SITE_OTHER): Payer: BC Managed Care – PPO | Admitting: Family Medicine

## 2022-11-19 VITALS — BP 149/95 | HR 79 | Temp 98.2°F | Ht 71.0 in | Wt 323.0 lb

## 2022-11-19 DIAGNOSIS — R030 Elevated blood-pressure reading, without diagnosis of hypertension: Secondary | ICD-10-CM | POA: Diagnosis not present

## 2022-11-19 DIAGNOSIS — E559 Vitamin D deficiency, unspecified: Secondary | ICD-10-CM

## 2022-11-19 DIAGNOSIS — E782 Mixed hyperlipidemia: Secondary | ICD-10-CM | POA: Insufficient documentation

## 2022-11-19 DIAGNOSIS — E7800 Pure hypercholesterolemia, unspecified: Secondary | ICD-10-CM | POA: Diagnosis not present

## 2022-11-19 DIAGNOSIS — E538 Deficiency of other specified B group vitamins: Secondary | ICD-10-CM

## 2022-11-19 DIAGNOSIS — R7303 Prediabetes: Secondary | ICD-10-CM

## 2022-11-19 DIAGNOSIS — Z6841 Body Mass Index (BMI) 40.0 and over, adult: Secondary | ICD-10-CM

## 2022-11-19 DIAGNOSIS — E669 Obesity, unspecified: Secondary | ICD-10-CM

## 2022-11-19 HISTORY — DX: Mixed hyperlipidemia: E78.2

## 2022-11-19 HISTORY — DX: Deficiency of other specified B group vitamins: E53.8

## 2022-11-19 MED ORDER — WEGOVY 0.25 MG/0.5ML ~~LOC~~ SOAJ: 0.2500 mg | SUBCUTANEOUS | 0 refills | Status: DC

## 2022-11-19 MED ORDER — VITAMIN D (ERGOCALCIFEROL) 1.25 MG (50000 UNIT) PO CAPS: 50000.0000 [IU] | ORAL_CAPSULE | ORAL | 0 refills | Status: DC

## 2022-11-19 NOTE — Assessment & Plan Note (Signed)
Pt does not care for Shawn Gray Delightful and prefers to have a carb serving with dinner instead of breakfast due to increased hunger at night.  He is doing better with meal planning at home, cooking at home and fitting in regular workouts.  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  He may exchange his carb at breakfast for one carb added to dinner, preferable a high fiber carb like a small potato, beans or Arnold Keto Bun.   He may increase his serving size of non starchy veggies with dinner.   His biggest barrier is eating out when traveling.  Given his excess hunger, lack of weight loss, will start Wegovy 0.25 mg once weekly injection. Patient denies a personal or family history of pancreatitis, medullary thyroid carcinoma or multiple endocrine neoplasia type II. Recommend reviewing pen training video online.

## 2022-11-19 NOTE — Assessment & Plan Note (Signed)
New.  Reviewed lab from last visit B12 level was 148.  Denies paresthesias but has been fatigued. Started OTC Vitamin B12.  Was instructed last month to start 1,000 mcg daily but reports taking 5,000 mcg daily.  Recheck vitamin B12 level next visit. If not improving, he will need injectable B12 for pernicious anemia

## 2022-11-19 NOTE — Assessment & Plan Note (Signed)
Lab Results  Component Value Date   CHOL 179 10/17/2022   HDL 48 10/17/2022   LDLCALC 120 (H) 10/17/2022   TRIG 56 10/17/2022   CHOLHDL 3 02/24/2021   Reviewed lab from last visit with patient He is not diabetic, denies fam hx of premature heart disease, HTN or smoking.  Continue a low saturated fat diet.  Recheck lipids in October.

## 2022-11-19 NOTE — Assessment & Plan Note (Signed)
BP running 120'2 to 130's systolic, 80s diastolic with home BP cuff He has never been on anti hypertensive meds Denies HA or CP  Continue to monitor home / work Bps Work on stress reduction and weight loss

## 2022-11-19 NOTE — Progress Notes (Signed)
Office: [phone]  /  Fax: [phone]  WEIGHT SUMMARY AND BIOMETRICS  Starting Date: 10/17/22  Starting Weight: 317lb   Weight Lost Since Last Visit: 0   Vitals Temp: 98.2 F (36.8 C) BP: (!) 149/95 Pulse Rate: 79 SpO2: 99 %   Body Composition  Body Fat %: 38.7 % Fat Mass (lbs): 125.2 lbs Muscle Mass (lbs): 188.8 lbs Total Body Water (lbs): 140.2 lbs Visceral Fat Rating : 22     HPI  Chief Complaint: OBESITY  Shawn Gray is here to discuss his progress with his obesity treatment plan. He is on the the Category 4 Plan and states he is following his eating plan approximately 80 % of the time. He states he is exercising 90 minutes 5-6 times per week.   Interval History:  Since last office visit he is up 6 lb He is still feeling hungry at the end of dinner He is rarely eating out unless traveling He and his wife are cooking more at home He did get off his meal plan when out of town at his mom's house this weekend He has been lifting weights 5 x a week Denies meal skipping or intake of SSBs  Pharmacotherapy: none  PHYSICAL EXAM:  Blood pressure (!) 149/95, pulse 79, temperature 98.2 F (36.8 C), height 5\' 11"  (1.803 m), weight (!) 323 lb (146.5 kg), SpO2 99 %. Body mass index is 45.05 kg/m.  General: He is overweight, cooperative, alert, well developed, and in no acute distress. PSYCH: Has normal mood, affect and thought process.   Lungs: Normal breathing effort, no conversational dyspnea.   ASSESSMENT AND PLAN  TREATMENT PLAN FOR OBESITY:  Recommended Dietary Goals  [place]te is currently in the action stage of change. As such, his goal is to continue weight management plan. He has agreed to the Category 4 Plan. the Category 4 Plan Behavioral Intervention  We discussed the following Behavioral Modification Strategies today: increasing lean protein intake, decreasing simple carbohydrates , increasing vegetables, increasing lower glycemic fruits,  increasing fiber rich foods, increasing water intake, reading food labels , continue to work on implementation of reduced calorie nutritional plan, continue to practice mindfulness when eating, and planning for success.  Additional resources provided today: NA  Recommended Physical Activity Goals  Pepe has been advised to work up to 150 minutes of moderate intensity aerobic activity a week and strengthening exercises 2-3 times per week for cardiovascular health, weight loss maintenance and preservation of muscle mass.   He has agreed to Start aerobic activity with a goal of 150 minutes a week at moderate intensity.   Pharmacotherapy changes for the treatment of obesity:  begin Wegovy 0.25 mg weekly  ASSOCIATED CONDITIONS ADDRESSED TODAY  Vitamin D deficiency Assessment & Plan: Last vitamin D Lab Results  Component Value Date   VD25OH 18.2 (L) 10/17/2022   Reviewed lab from last visit.  Vitamin D def is a new diagnosis.  He c/o fatigue.  We discussed his risk for fatigue, poor immune function, bone loss and leptin resistance due to vitamin D def with a target level 50-70.  Begin RX vitamin D 50,000 IU once weekly, Recheck lab in 3-4 mos  Orders: -     Vitamin D (Ergocalciferol); Take 1 capsule (50,000 Units total) by mouth every 7 (seven) days.  Dispense: 5 capsule; Refill: 0  Obesity with starting BMI of 44.2 Assessment & Plan: Pt does not care for Terrill Mohr Delightful and prefers to have a carb serving with dinner instead  of breakfast due to increased hunger at night.  He is doing better with meal planning at home, cooking at home and fitting in regular workouts.  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  He may exchange his carb at breakfast for one carb added to dinner, preferable a high fiber carb like a small potato, beans or Arnold Keto Bun.   He may increase his serving size of non starchy veggies with dinner.   His biggest barrier  is eating out when traveling.  Given his excess hunger, lack of weight loss, will start Wegovy 0.25 mg once weekly injection. Patient denies a personal or family history of pancreatitis, medullary thyroid carcinoma or multiple endocrine neoplasia type II. Recommend reviewing pen training video online.   Orders: -     Wegovy; Inject 0.25 mg into the skin once a week.  Dispense: 2 mL; Refill: 0  BMI 45.0-49.9, adult (HCC)  B12 deficiency Assessment & Plan: New.  Reviewed lab from last visit B12 level was 148.  Denies paresthesias but has been fatigued. Started OTC Vitamin B12.  Was instructed last month to start 1,000 mcg daily but reports taking 5,000 mcg daily.  Recheck vitamin B12 level next visit. If not improving, he will need injectable B12 for pernicious anemia   White coat syndrome with high blood pressure without hypertension Assessment & Plan: BP running 120'2 to 130's systolic, 80s diastolic with home BP cuff He has never been on anti hypertensive meds Denies HA or CP  Continue to monitor home / work Bps Work on stress reduction and weight loss   Pure hypercholesterolemia Assessment & Plan: Lab Results  Component Value Date   CHOL 179 10/17/2022   HDL 48 10/17/2022   LDLCALC 120 (H) 10/17/2022   TRIG 56 10/17/2022   CHOLHDL 3 02/24/2021   Reviewed lab from last visit with patient He is not diabetic, denies fam hx of premature heart disease, HTN or smoking.  Continue a low saturated fat diet.  Recheck lipids in October.    Prediabetes      He was informed of the importance of frequent follow up visits to maximize his success with intensive lifestyle modifications for his multiple health conditions.   ATTESTASTION STATEMENTS:  Reviewed by clinician on day of visit: allergies, medications, problem list, medical history, surgical history, family history, social history, and previous encounter notes pertinent to obesity diagnosis.   I have personally  spent 30 minutes total time today in preparation, patient care, nutritional counseling and documentation for this visit, including the following: review of clinical lab tests; review of medical tests/procedures/services.      Glennis Brink, DO DABFM, DABOM [facility] [address] [place], [place] [address] [phone]

## 2022-11-19 NOTE — Assessment & Plan Note (Signed)
Last vitamin D Lab Results  Component Value Date   VD25OH 18.2 (L) 10/17/2022   Reviewed lab from last visit.  Vitamin D def is a new diagnosis.  He c/o fatigue.  We discussed his risk for fatigue, poor immune function, bone loss and leptin resistance due to vitamin D def with a target level 50-70.  Begin RX vitamin D 50,000 IU once weekly, Recheck lab in 3-4 mos

## 2022-11-21 ENCOUNTER — Ambulatory Visit: Payer: Managed Care, Other (non HMO) | Admitting: Sports Medicine

## 2022-11-21 VITALS — HR 81 | Ht 71.0 in | Wt 323.0 lb

## 2022-11-21 DIAGNOSIS — M5442 Lumbago with sciatica, left side: Secondary | ICD-10-CM | POA: Diagnosis not present

## 2022-11-21 DIAGNOSIS — M9905 Segmental and somatic dysfunction of pelvic region: Secondary | ICD-10-CM | POA: Diagnosis not present

## 2022-11-21 DIAGNOSIS — M9904 Segmental and somatic dysfunction of sacral region: Secondary | ICD-10-CM | POA: Diagnosis not present

## 2022-11-21 DIAGNOSIS — M9903 Segmental and somatic dysfunction of lumbar region: Secondary | ICD-10-CM

## 2022-11-21 DIAGNOSIS — G8929 Other chronic pain: Secondary | ICD-10-CM | POA: Diagnosis not present

## 2022-11-21 MED ORDER — MELOXICAM 15 MG PO TABS: 15.0000 mg | ORAL_TABLET | Freq: Every day | ORAL | 0 refills | Status: DC

## 2022-11-21 NOTE — Progress Notes (Signed)
[facility] [address] [place] [address] Phone: [phone]   Assessment and Plan:     1. Chronic bilateral low back pain with left-sided sciatica 2. Somatic dysfunction of lumbar region 3. Somatic dysfunction of pelvic region 4. Somatic dysfunction of sacral region -Chronic with exacerbation, subsequent sports medicine visit - Recurrence of low back pain.  Patient describes flare of left-sided low back pain with left-sided radicular symptoms occurring over the past 3 days, however on physical exam he has no radicular symptoms and is more tender in right lumbar region - Start meloxicam 15 mg daily x2 weeks.  If still having pain after 2 weeks, complete 3rd-week of meloxicam. May use remaining meloxicam as needed once daily for pain control.  Do not to use additional NSAIDs while taking meloxicam.  May use Tylenol [phone] mg 2 to 3 times a day for breakthrough pain. -Continue HEP for low back - No red flag symptoms, so no imaging at today's visit - Patient has received significant relief with OMT in the past.  Elects for repeat OMT today.  Tolerated well per note below. - Decision today to treat with OMT was based on Physical Exam   After verbal consent patient was treated with HVLA (high velocity low amplitude), ME (muscle energy), FPR (flex positional release), ST (soft tissue), PC/PD (Pelvic Compression/ Pelvic Decompression) techniques in sacrum, lumbar, and pelvic areas. Patient tolerated the procedure well with improvement in symptoms.  Patient educated on potential side effects of soreness and recommended to rest, hydrate, and use Tylenol as needed for pain control.   Pertinent previous records reviewed include none   Follow Up: Patient has follow-up already scheduled with Dr. Katrinka Gray in 3 weeks.  May keep this appointment and follow-up with me sooner as needed   Subjective:   I, Shawn Gray, am serving as a Neurosurgeon for Doctor  Shawn Gray  Chief Complaint: back pain   HPI:  09/04/2022 Patient is responded very well, will consider the possibility of osteopathic manipulation for some of the back pain and other things.  Discussed icing regimen and home exercises, which activities to do and which ones to avoid.  We discussed proper shoes and over-the-counter orthotics still.  Follow-up again with me 6 to 8 weeks.  We did make changes to his shoes to help with the anterior BLS tendinitis.  Will follow-up again in the near future for other problems.      Update 10/16/2022 Shawn Gray is a 32 y.o. male coming in with complaint of R foot pain. Patient states that he is doing better. Has pain over the top of the foot that seems to be intermittent.    Thoracic and lumbar spine pain has increased or he feels that he is noticing it more recenlty due to ankle feeling better.   11/21/22 Patient states that he has been having back spasms all day lower left side here for manipulation    Relevant Historical Information: Elevated BMI  Additional pertinent review of systems negative.  Current Outpatient Medications  Medication Sig Dispense Refill   meloxicam (MOBIC) 15 MG tablet Take 1 tablet (15 mg total) by mouth daily. 30 tablet 0   Semaglutide-Weight Management (WEGOVY) 0.25 MG/0.5ML SOAJ Inject 0.25 mg into the skin once a week. 2 mL 0   Vitamin D, Ergocalciferol, (DRISDOL) 1.25 MG (50000 UNIT) CAPS capsule Take 1 capsule (50,000 Units total) by mouth every 7 (seven) days. 5 capsule 0   No  current facility-administered medications for this visit.      Objective:     Vitals:   11/21/22 1351  Pulse: 81  SpO2: 98%  Weight: (!) 323 lb (146.5 kg)  Height: 5\' 11"  (1.803 m)      Body mass index is 45.05 kg/m.    Physical Exam:      Gen: Appears well, nad, nontoxic and pleasant Psych: Alert and oriented, appropriate mood and affect Neuro: sensation intact, strength is 5/5 in upper and lower extremities,  muscle tone wnl Skin: no susupicious lesions or rashes  Back - Normal skin, Spine with normal alignment and no deformity.   No tenderness to vertebral process palpation.   Right lumbar paraspinous muscles are   tender and without spasm NTTP gluteal musculature Straight leg raise negative Trendelenberg negative Piriformis Test negative   OMT Physical Exam:  ASIS Compression Test: Positive Right Sacrum: NTTP bilateral sacral base, positive sphinx Lumbar: TTP right paraspinal, limited rotation bilaterally Pelvis: Right anterior innominate    Electronically signed by:  Sh[facility] 2:29 PM 11/21/22

## 2022-11-21 NOTE — Patient Instructions (Addendum)
Good to see you  - Start meloxicam 15 mg daily x2 weeks.  If still having pain after 2 weeks, complete 3rd-week of meloxicam. May use remaining meloxicam as needed once daily for pain control.  Do not to use additional NSAIDs while taking meloxicam.  May use Tylenol [phone] mg 2 to 3 times a day for breakthrough pain. Keep follow up with Dr. [name]  Continue HEP

## 2022-11-22 ENCOUNTER — Encounter (INDEPENDENT_AMBULATORY_CARE_PROVIDER_SITE_OTHER): Payer: Self-pay | Admitting: *Deleted

## 2022-11-29 ENCOUNTER — Telehealth (INDEPENDENT_AMBULATORY_CARE_PROVIDER_SITE_OTHER): Payer: Self-pay | Admitting: Family Medicine

## 2022-11-29 NOTE — Telephone Encounter (Signed)
Pt called 11/29/22 his insurance will not approve [place] wants to switch to [facility] he said his insurance will pay need a Prior Auth

## 2022-12-06 NOTE — Progress Notes (Deleted)
  [name] [address] [place] [address] Phone: [phone] Subjective:    I'm seeing this patient by the request  of:  Olive Bass, FNP  CC:   BJY:NWGNFAOZHY  Shawn Gray is a 32 y.o. male coming in with complaint of back and neck pain. OMT 10/16/2022. Also f/u for R ankle pain. Patient did see Dr. Jean Rosenthal for an increase in his back pain since last seeing me. Patient states   Medications patient has been prescribed:   Taking:         Reviewed prior external information including notes and imaging from previsou exam, outside providers and external EMR if available.   As well as notes that were available from care everywhere and other healthcare systems.  Past medical history, social, surgical and family history all reviewed in electronic medical record.  No pertanent information unless stated regarding to the chief complaint.   Past Medical History:  Diagnosis Date   Back pain    Bilateral swelling of feet    Chest pain    High blood pressure    Migraine    Obesity    Sinus tarsi syndrome    Strep throat     Allergies  Allergen Reactions   Other Anaphylaxis    Tree nuts   Nickel Rash    Topical allergy causes burning     Review of Systems:  No headache, visual changes, nausea, vomiting, diarrhea, constipation, dizziness, abdominal pain, skin rash, fevers, chills, night sweats, weight loss, swollen lymph nodes, body aches, joint swelling, chest pain, shortness of breath, mood changes. POSITIVE muscle aches  Objective  There were no vitals taken for this visit.   General: No apparent distress alert and oriented x3 mood and affect normal, dressed appropriately.  HEENT: Pupils equal, extraocular movements intact  Respiratory: Patient's speak in full sentences and does not appear short of breath  Cardiovascular: No lower extremity edema, non tender, no erythema  Gait MSK:  Back   Osteopathic findings  C2  flexed rotated and side bent right C6 flexed rotated and side bent left T3 extended rotated and side bent right inhaled rib T9 extended rotated and side bent left L2 flexed rotated and side bent right Sacrum right on right       Assessment and Plan:  No problem-specific Assessment & Plan notes found for this encounter.    Nonallopathic problems  Decision today to treat with OMT was based on Physical Exam  After verbal consent patient was treated with HVLA, ME, FPR techniques in cervical, rib, thoracic, lumbar, and sacral  areas  Patient tolerated the procedure well with improvement in symptoms  Patient given exercises, stretches and lifestyle modifications  See medications in patient instructions if given  Patient will follow up in 4-8 weeks             Note: This dictation was prepared with Dragon dictation along with smaller phrase technology. Any transcriptional errors that result from this process are unintentional.

## 2022-12-11 ENCOUNTER — Ambulatory Visit: Payer: BC Managed Care – PPO | Admitting: Family Medicine

## 2022-12-11 NOTE — Progress Notes (Unsigned)
[name] [address] [place] [address] Phone: [phone] Subjective:   Shawn Gray, am serving as a scribe for Dr. Antoine Gray.  I'm seeing this patient by the request  of:  Olive Bass, FNP  CC: back and neck pain follow up   BJY:NWGNFAOZHY  Shawn Gray is a 32 y.o. male coming in with complaint of back and neck pain. OMT on 11/21/2022. Patient states that the standing desk has been helpful to reduce his back pain.   Pain over top of foot continues to bother him daily. Pain radiates down into toes. Pain improves when he wears tighter shoes.   Patient states that L shoulder is painful when he does flys.   Medications patient has been prescribed:   Taking:         Reviewed prior external information including notes and imaging from previsou exam, outside providers and external EMR if available.   As well as notes that were available from care everywhere and other healthcare systems.  Past medical history, social, surgical and family history all reviewed in electronic medical record.  No pertanent information unless stated regarding to the chief complaint.   Past Medical History:  Diagnosis Date   Back pain    Bilateral swelling of feet    Chest pain    High blood pressure    Migraine    Obesity    Sinus tarsi syndrome    Strep throat     Allergies  Allergen Reactions   Other Anaphylaxis    Tree nuts   Nickel Rash    Topical allergy causes burning     Review of Systems:  No headache, visual changes, nausea, vomiting, diarrhea, constipation, dizziness, abdominal pain, skin rash, fevers, chills, night sweats, weight loss, swollen lymph nodes, body aches, joint swelling, chest pain, shortness of breath, mood changes. POSITIVE muscle aches  Objective  Blood pressure 130/88, pulse 72, height 5\' 11"  (1.803 m), weight (!) 327 lb (148.3 kg), SpO2 98 %.   General: No apparent distress alert and oriented x3  mood and affect normal, dressed appropriately.  HEENT: Pupils equal, extraocular movements intact  Respiratory: Patient's speak in full sentences and does not appear short of breath  Cardiovascular: No lower extremity edema, non tender, no erythema  MSK:  Back does have loss of lordosis of the back.  He does still have tightness noted in the paraspinal musculature.  Tightness with FABER testing noted.  Osteopathic findings  C3 flexed rotated and side bent right C7 flexed rotated and side bent left T3 extended rotated and side bent right inhaled rib T7 extended rotated and side bent left L2 flexed rotated and side bent right Sacrum right on right       Assessment and Plan:  Low back pain Low back is multifactorial.  Discussed which activities to do and which ones to avoid.  Increase activity slowly.  Has had difficulty with the feet that I do think unfortunately contributes to some of the discomfort and pain as well.  Increase activity slowly.  Follow-up with me again in 6 to 8 weeks.    Nonallopathic problems  Decision today to treat with OMT was based on Physical Exam  After verbal consent patient was treated with HVLA, ME, FPR techniques in cervical, rib, thoracic, lumbar, and sacral  areas  Patient tolerated the procedure well with improvement in symptoms  Patient given exercises, stretches and lifestyle modifications  See medications in patient instructions  if given  Patient will follow up in 4-8 weeks     The above documentation has been reviewed and is accurate and complete Judi Saa, DO         Note: This dictation was prepared with Dragon dictation along with smaller phrase technology. Any transcriptional errors that result from this process are unintentional.

## 2022-12-12 ENCOUNTER — Ambulatory Visit (INDEPENDENT_AMBULATORY_CARE_PROVIDER_SITE_OTHER): Payer: Managed Care, Other (non HMO) | Admitting: Family Medicine

## 2022-12-12 VITALS — BP 130/88 | HR 72 | Ht 71.0 in | Wt 327.0 lb

## 2022-12-12 DIAGNOSIS — M9903 Segmental and somatic dysfunction of lumbar region: Secondary | ICD-10-CM | POA: Diagnosis not present

## 2022-12-12 DIAGNOSIS — G8929 Other chronic pain: Secondary | ICD-10-CM | POA: Diagnosis not present

## 2022-12-12 DIAGNOSIS — M9901 Segmental and somatic dysfunction of cervical region: Secondary | ICD-10-CM

## 2022-12-12 DIAGNOSIS — M9904 Segmental and somatic dysfunction of sacral region: Secondary | ICD-10-CM

## 2022-12-12 DIAGNOSIS — M9908 Segmental and somatic dysfunction of rib cage: Secondary | ICD-10-CM | POA: Diagnosis not present

## 2022-12-12 DIAGNOSIS — M545 Low back pain, unspecified: Secondary | ICD-10-CM | POA: Diagnosis not present

## 2022-12-12 DIAGNOSIS — M9902 Segmental and somatic dysfunction of thoracic region: Secondary | ICD-10-CM

## 2022-12-12 NOTE — Assessment & Plan Note (Signed)
Low back is multifactorial.  Discussed which activities to do and which ones to avoid.  Increase activity slowly.  Has had difficulty with the feet that I do think unfortunately contributes to some of the discomfort and pain as well.  Increase activity slowly.  Follow-up with me again in 6 to 8 weeks.

## 2022-12-12 NOTE — Patient Instructions (Addendum)
Keep hands in peripheral vision Good to see you! See you again in 2 months When doing flys keep at nipple line

## 2022-12-13 ENCOUNTER — Ambulatory Visit (INDEPENDENT_AMBULATORY_CARE_PROVIDER_SITE_OTHER): Payer: BC Managed Care – PPO | Admitting: Family Medicine

## 2022-12-13 ENCOUNTER — Encounter (INDEPENDENT_AMBULATORY_CARE_PROVIDER_SITE_OTHER): Payer: Self-pay | Admitting: Family Medicine

## 2022-12-13 VITALS — BP 164/106 | HR 72 | Temp 98.1°F | Ht 71.0 in | Wt 322.0 lb

## 2022-12-13 DIAGNOSIS — E559 Vitamin D deficiency, unspecified: Secondary | ICD-10-CM | POA: Diagnosis not present

## 2022-12-13 DIAGNOSIS — R7303 Prediabetes: Secondary | ICD-10-CM

## 2022-12-13 DIAGNOSIS — Z6841 Body Mass Index (BMI) 40.0 and over, adult: Secondary | ICD-10-CM

## 2022-12-13 DIAGNOSIS — R030 Elevated blood-pressure reading, without diagnosis of hypertension: Secondary | ICD-10-CM | POA: Diagnosis not present

## 2022-12-13 NOTE — Assessment & Plan Note (Signed)
Lab Results  Component Value Date   HGBA1C 6.1 (H) 10/17/2022   Patient is actively working on reducing intake of added sugar and refined carbohydrates.  He has gained 5 pounds over the past 1 month of medically supervised weight management.  He is consistent with regular exercise 5 days a week.  Continue to limit intake of added sugar and refined carbohydrates, reading labels on food and drink for sugar and avoiding products with over 8 g of sugar per serving.  Continue regular exercise both cardio and resistance training 5 days a week.  Consider use of metformin.

## 2022-12-13 NOTE — Assessment & Plan Note (Signed)
Last vitamin D Lab Results  Component Value Date   VD25OH 18.2 (L) 10/17/2022   He is doing well on prescription vitamin D 50,000 IU once weekly.  His energy level is starting to improve.  He denies adverse side effects.  Recheck vitamin D level in the next 3 months

## 2022-12-13 NOTE — Progress Notes (Signed)
Office: [phone]  /  Fax: [phone]  WEIGHT SUMMARY AND BIOMETRICS  Starting Date: 10/17/22  Starting Weight: 317 lb   Weight Lost Since Last Visit: 1 lb   Vitals Temp: 98.1 F (36.7 C) BP: (!) 164/106 Pulse Rate: 72 SpO2: 99 %   Body Composition  Body Fat %: 38.5 % Fat Mass (lbs): 124.2 lbs Muscle Mass (lbs): 188.8 lbs Total Body Water (lbs): 139.8 lbs Visceral Fat Rating : 22     HPI  Chief Complaint: OBESITY  Shawn Gray is here to discuss his progress with his obesity treatment plan. He is on the the Category 4 Plan and states he is following his eating plan approximately 85 % of the time. He states he is exercising weight training and cardio 90+ minutes 4-5 times per week.   Interval History:  Since last office visit he is down 1 lb He has a net weight gain of 5 lb in the past month His insurance did not cover Wegovy He is weight training 4-5 x a week and is using cardio machines 4-5 x a week He is eating most of the foods on his meal plan.   He dislikes a sandwich for lunch  He is grilling meat and has meat and veggie.    Pharmacotherapy: None  PHYSICAL EXAM:  Blood pressure (!) 164/106, pulse 72, temperature 98.1 F (36.7 C), height 5\' 11"  (1.803 m), weight (!) 322 lb (146.1 kg), SpO2 99 %. Body mass index is 44.91 kg/m.  General: He is overweight, cooperative, alert, well developed, and in no acute distress. PSYCH: Has normal mood, affect and thought process.   Lungs: Normal breathing effort, no conversational dyspnea.   ASSESSMENT AND PLAN  TREATMENT PLAN FOR OBESITY:  Recommended Dietary Goals  Shawn Gray is currently in the action stage of change. As such, his goal is to continue weight management plan. He has agreed to keeping a food journal and adhering to recommended goals of 2200 calories and 150 g or more of protein. -- Reviewing his RMR, he appears to be under consuming food which has been making it hard for him to gain muscle mass.   For his level of resistance training exercise, I would anticipate a muscle gain. --We discussed increasing his intake of healthy fats, fruits and vegetables. Behavioral Intervention  We discussed the following Behavioral Modification Strategies today: increasing lean protein intake, decreasing simple carbohydrates , increasing vegetables, increasing lower glycemic fruits, increasing fiber rich foods, avoiding skipping meals, increasing water intake, work on meal planning and preparation, work on tracking and journaling calories using tracking application, continue to practice mindfulness when eating, and planning for success.  Additional resources provided today: NA  Recommended Physical Activity Goals  Shawn Gray has been advised to work up to 150 minutes of moderate intensity aerobic activity a week and strengthening exercises 2-3 times per week for cardiovascular health, weight loss maintenance and preservation of muscle mass.   He has agreed to Work on scheduling and tracking physical activity.   Pharmacotherapy changes for the treatment of obesity: None  ASSOCIATED CONDITIONS ADDRESSED TODAY  Prediabetes Assessment & Plan: Lab Results  Component Value Date   HGBA1C 6.1 (H) 10/17/2022   Patient is actively working on reducing intake of added sugar and refined carbohydrates.  He has gained 5 pounds over the past 1 month of medically supervised weight management.  He is consistent with regular exercise 5 days a week.  Continue to limit intake of added sugar and refined carbohydrates, reading  labels on food and drink for sugar and avoiding products with over 8 g of sugar per serving.  Continue regular exercise both cardio and resistance training 5 days a week.  Consider use of metformin.   Morbid obesity (HCC) with starting BMI 45  BMI 40.0-44.9, adult (HCC)  White coat syndrome with high blood pressure without hypertension Assessment & Plan: His blood pressure continues to run high  here.  He denies headache or chest pain.  Reviewed his blood pressure readings from his most recent primary care visits and they are all less than 140/90.  Continue to limit intake of high sodium foods.  Avoid use of stimulants.   Vitamin D deficiency Assessment & Plan: Last vitamin D Lab Results  Component Value Date   VD25OH 18.2 (L) 10/17/2022   He is doing well on prescription vitamin D 50,000 IU once weekly.  His energy level is starting to improve.  He denies adverse side effects.  Recheck vitamin D level in the next 3 months       He was informed of the importance of frequent follow up visits to maximize his success with intensive lifestyle modifications for his multiple health conditions.   ATTESTASTION STATEMENTS:  Reviewed by clinician on day of visit: allergies, medications, problem list, medical history, surgical history, family history, social history, and previous encounter notes pertinent to obesity diagnosis.   I have personally spent 30 minutes total time today in preparation, patient care, nutritional counseling and documentation for this visit, including the following: review of clinical lab tests; review of medical tests/procedures/services.      Shawn Brink, DO DABFM, DABOM [facility] [address] [place], [place] [address] [phone]

## 2022-12-13 NOTE — Assessment & Plan Note (Signed)
His blood pressure continues to run high here.  He denies headache or chest pain.  Reviewed his blood pressure readings from his most recent primary care visits and they are all less than 140/90.  Continue to limit intake of high sodium foods.  Avoid use of stimulants.

## 2023-01-09 ENCOUNTER — Encounter (INDEPENDENT_AMBULATORY_CARE_PROVIDER_SITE_OTHER): Payer: Self-pay | Admitting: Family Medicine

## 2023-01-09 ENCOUNTER — Ambulatory Visit (INDEPENDENT_AMBULATORY_CARE_PROVIDER_SITE_OTHER): Payer: Managed Care, Other (non HMO) | Admitting: Family Medicine

## 2023-01-09 VITALS — BP 167/117 | HR 92 | Temp 98.2°F | Ht 71.0 in | Wt 313.0 lb

## 2023-01-09 DIAGNOSIS — Z6841 Body Mass Index (BMI) 40.0 and over, adult: Secondary | ICD-10-CM

## 2023-01-09 DIAGNOSIS — R7303 Prediabetes: Secondary | ICD-10-CM | POA: Diagnosis not present

## 2023-01-09 DIAGNOSIS — E559 Vitamin D deficiency, unspecified: Secondary | ICD-10-CM | POA: Diagnosis not present

## 2023-01-09 DIAGNOSIS — I10 Essential (primary) hypertension: Secondary | ICD-10-CM | POA: Insufficient documentation

## 2023-01-09 DIAGNOSIS — G4730 Sleep apnea, unspecified: Secondary | ICD-10-CM

## 2023-01-09 HISTORY — DX: Essential (primary) hypertension: I10

## 2023-01-09 MED ORDER — AMLODIPINE BESYLATE 5 MG PO TABS: 5.0000 mg | ORAL_TABLET | Freq: Every day | ORAL | 0 refills | Status: DC

## 2023-01-09 MED ORDER — AMLODIPINE BESYLATE 5 MG PO TABS: 5.0000 mg | ORAL_TABLET | Freq: Every day | ORAL | 1 refills | Status: DC

## 2023-01-09 MED ORDER — METFORMIN HCL ER 500 MG PO TB24: 500.0000 mg | ORAL_TABLET | Freq: Every day | ORAL | 0 refills | Status: DC

## 2023-01-09 MED ORDER — VITAMIN D (ERGOCALCIFEROL) 1.25 MG (50000 UNIT) PO CAPS: 50000.0000 [IU] | ORAL_CAPSULE | ORAL | 0 refills | Status: DC

## 2023-01-09 NOTE — Assessment & Plan Note (Signed)
Shawn Gray appears to be high risk for OSA.  He is awakening when sleepy at night and is snacking, waking up with morning HA's and has daytime sleepiness and a large neck circumference.  Referral place in April for [facility] but he postponed due to his work schedule.  He now feels ready to set up an office visit to discuss polysomnogram.

## 2023-01-09 NOTE — Assessment & Plan Note (Signed)
Lab Results  Component Value Date   HGBA1C 6.1 (H) 10/17/2022   He is actively working on a low sugar/ low starch diet, regular exercise and weight loss. He has a net 4 lb of weight loss in 2 mos Continue PS/[place] with ~2200 cal/ day to include 150 g of protein daily.  Continue active lifestyle changes with diet and exercise Begin metformin 500 mg XR once daily with food Repeat CMP, A1c, B12 in 1 month

## 2023-01-09 NOTE — Progress Notes (Signed)
Office: [phone]  /  Fax: [phone]  WEIGHT SUMMARY AND BIOMETRICS  Starting Date: 10/17/22  Starting Weight: 317lb   Weight Lost Since Last Visit: 9lb   Vitals Temp: 98.2 F (36.8 C) BP: (!) 167/117 Pulse Rate: 92 SpO2: 96 %   Body Composition  Body Fat %: 38 % Fat Mass (lbs): 119 lbs Muscle Mass (lbs): 185 lbs Total Body Water (lbs): 136.4 lbs Visceral Fat Rating : 21     HPI  Chief Complaint: OBESITY  Shawn Gray is here to discuss his progress with his obesity treatment plan. He is on the the Category 4 Plan and states he is following his eating plan approximately 90 % of the time. He states he is exercising with weights and doing cardio 60 minutes 4 to 5 times per week.   Interval History:  Since last office visit he is down 9 lb He is down 3.8 lb of muscle and down 5.2 lb of body fat in 1 month He continues to do well with eating on plan, cooking more at home He feels full and satisfied He has more hunger at night-- realizes this is boredom eating and he is tired He is working out 4-5 x a week He is down a total of 4 lb in the past 2 mos He is logging calories on occasion He has not yet completed a sleep study He is having morning Has Energy level is starting to improve   Pharmacotherapy: none  PHYSICAL EXAM:  Blood pressure (!) 167/117, pulse 92, temperature 98.2 F (36.8 C), height 5\' 11"  (1.803 m), weight (!) 313 lb (142 kg), SpO2 96 %. Body mass index is 43.65 kg/m.  General: He is overweight, cooperative, alert, well developed, and in no acute distress. PSYCH: Has normal mood, affect and thought process.   Lungs: Normal breathing effort, no conversational dyspnea.   ASSESSMENT AND PLAN  TREATMENT PLAN FOR OBESITY:  Recommended Dietary Goals  Shawn Gray is currently in the action stage of change. As such, his goal is to continue weight management plan. He has agreed to keeping a food journal and adhering to recommended goals of 2200  calories and 150g of protein.  Behavioral Intervention  We discussed the following Behavioral Modification Strategies today: increasing lean protein intake, decreasing simple carbohydrates , increasing vegetables, increasing lower glycemic fruits, increasing water intake, work on meal planning and preparation, keeping healthy foods at home, continue to practice mindfulness when eating, and planning for success.  Additional resources provided today: NA  Recommended Physical Activity Goals  Shawn Gray has been advised to work up to 150 minutes of moderate intensity aerobic activity a week and strengthening exercises 2-3 times per week for cardiovascular health, weight loss maintenance and preservation of muscle mass.   He has agreed to Continue current level of physical activity   Pharmacotherapy changes for the treatment of obesity: begin metformin XR 500 mg daily  ASSOCIATED CONDITIONS ADDRESSED TODAY  Primary hypertension Assessment & Plan: BP Readings from Last 3 Encounters:  01/09/23 (!) 167/117  12/13/22 (!) 164/106  12/12/22 130/88    [name] continues to run elevated BP reading.  He is awakening with a dull frontal headache almost daily.  Denies CP.  Has + fam hx of HTN.  Has never used anti hypertensive meds.  Awaiting sleep medicine consult.  Actively working on weight loss.  Continue active plan for weight reduction. Begin Amlodpine 5 mg daily.  Reviewed potential adverse SE Repeat CMP in the next month.  Orders: -     amLODIPine Besylate; Take 1 tablet (5 mg total) by mouth daily.  Dispense: 30 tablet; Refill: 1  Prediabetes Assessment & Plan: Lab Results  Component Value Date   HGBA1C 6.1 (H) 10/17/2022   He is actively working on a low sugar/ low starch diet, regular exercise and weight loss. He has a net 4 lb of weight loss in 2 mos Continue PS/[place] with ~2200 cal/ day to include 150 g of protein daily.  Continue active lifestyle changes with diet and exercise Begin  metformin 500 mg XR once daily with food Repeat CMP, A1c, B12 in 1 month  Orders: -     metFORMIN HCl ER; Take 1 tablet (500 mg total) by mouth daily with breakfast.  Dispense: 30 tablet; Refill: 0  Vitamin D deficiency Assessment & Plan: Last vitamin D Lab Results  Component Value Date   VD25OH 18.2 (L) 10/17/2022   He is doing well on Rx ergocalciferol weekly Energy level stable Denies adverse SE  Recheck level in 1-2 mos  Orders: -     Vitamin D (Ergocalciferol); Take 1 capsule (50,000 Units total) by mouth every 7 (seven) days.  Dispense: 5 capsule; Refill: 0  Morbid obesity (HCC) with starting BMI 45  BMI 40.0-44.9, adult (HCC)  Sleep-disordered breathing Assessment & Plan: Shawn Gray appears to be high risk for OSA.  He is awakening when sleepy at night and is snacking, waking up with morning HA's and has daytime sleepiness and a large neck circumference.  Referral place in April for [facility] but he postponed due to his work schedule.  He now feels ready to set up an office visit to discuss polysomnogram.       He was informed of the importance of frequent follow up visits to maximize his success with intensive lifestyle modifications for his multiple health conditions.   ATTESTASTION STATEMENTS:  Reviewed by clinician on day of visit: allergies, medications, problem list, medical history, surgical history, family history, social history, and previous encounter notes pertinent to obesity diagnosis.   I have personally spent 30 minutes total time today in preparation, patient care, nutritional counseling and documentation for this visit, including the following: review of clinical lab tests; review of medical tests/procedures/services.      Glennis Brink, DO DABFM, DABOM [facility] [address] [place], [place] [address] [phone]

## 2023-01-09 NOTE — Assessment & Plan Note (Signed)
Last vitamin D Lab Results  Component Value Date   VD25OH 18.2 (L) 10/17/2022   He is doing well on Rx ergocalciferol weekly Energy level stable Denies adverse SE  Recheck level in 1-2 mos

## 2023-01-09 NOTE — Assessment & Plan Note (Signed)
BP Readings from Last 3 Encounters:  01/09/23 (!) 167/117  12/13/22 (!) 164/106  12/12/22 130/88    Shawn Gray continues to run elevated BP reading.  He is awakening with a dull frontal headache almost daily.  Denies CP.  Has + fam hx of HTN.  Has never used anti hypertensive meds.  Awaiting sleep medicine consult.  Actively working on weight loss.  Continue active plan for weight reduction. Begin Amlodpine 5 mg daily.  Reviewed potential adverse SE Repeat CMP in the next month.

## 2023-01-31 ENCOUNTER — Telehealth (INDEPENDENT_AMBULATORY_CARE_PROVIDER_SITE_OTHER): Payer: Self-pay | Admitting: Family Medicine

## 2023-02-06 ENCOUNTER — Other Ambulatory Visit (INDEPENDENT_AMBULATORY_CARE_PROVIDER_SITE_OTHER): Payer: Self-pay | Admitting: Family Medicine

## 2023-02-06 DIAGNOSIS — R7303 Prediabetes: Secondary | ICD-10-CM

## 2023-02-13 ENCOUNTER — Ambulatory Visit (INDEPENDENT_AMBULATORY_CARE_PROVIDER_SITE_OTHER): Payer: BC Managed Care – PPO | Admitting: Family Medicine

## 2023-02-13 ENCOUNTER — Ambulatory Visit: Payer: BC Managed Care – PPO | Admitting: Family Medicine

## 2023-04-04 NOTE — Telephone Encounter (Signed)
error 

## 2023-05-21 NOTE — Progress Notes (Unsigned)
    [name] [name] [address] [place] [address] Phone: [phone]   Assessment and Plan:     There are no diagnoses linked to this encounter.  ***   Pertinent previous records reviewed include ***   Follow Up: ***     Subjective:   I, Bess Saltzman, am serving as a Neurosurgeon for Doctor [employer]  Chief Complaint: OMT   HPI:   Shawn Gray is a 32 y.o. male coming in with complaint of back and neck pain. OMT on 11/21/2022. Patient states that the standing desk has been helpful to reduce his back pain.    Pain over top of foot continues to bother him daily. Pain radiates down into toes. Pain improves when he wears tighter shoes.    Patient states that L shoulder is painful when he does flys.    Medications patient has been prescribed:   05/22/2023 Patient states   Relevant Historical Information: ***  Additional pertinent review of systems negative.   Current Outpatient Medications:    amLODipine (NORVASC) 5 MG tablet, Take 1 tablet (5 mg total) by mouth daily., Disp: 30 tablet, Rfl: 1   meloxicam (MOBIC) 15 MG tablet, Take 1 tablet (15 mg total) by mouth daily., Disp: 30 tablet, Rfl: 0   metFORMIN (GLUCOPHAGE-XR) 500 MG 24 hr tablet, TAKE 1 TABLET BY MOUTH EVERY DAY WITH BREAKFAST, Disp: 30 tablet, Rfl: 0   Vitamin D, Ergocalciferol, (DRISDOL) 1.25 MG (50000 UNIT) CAPS capsule, Take 1 capsule (50,000 Units total) by mouth every 7 (seven) days., Disp: 5 capsule, Rfl: 0   Objective:     There were no vitals filed for this visit.    There is no height or weight on file to calculate BMI.    Physical Exam:    ***   Electronically signed by:  [name] [name] 3:29 PM 05/21/23

## 2023-05-22 ENCOUNTER — Ambulatory Visit (INDEPENDENT_AMBULATORY_CARE_PROVIDER_SITE_OTHER): Payer: No Typology Code available for payment source | Admitting: Sports Medicine

## 2023-05-22 VITALS — BP 132/82 | HR 81 | Ht 71.0 in | Wt 308.0 lb

## 2023-05-22 DIAGNOSIS — M9905 Segmental and somatic dysfunction of pelvic region: Secondary | ICD-10-CM | POA: Diagnosis not present

## 2023-05-22 DIAGNOSIS — G8929 Other chronic pain: Secondary | ICD-10-CM

## 2023-05-22 DIAGNOSIS — M7552 Bursitis of left shoulder: Secondary | ICD-10-CM

## 2023-05-22 DIAGNOSIS — M545 Low back pain, unspecified: Secondary | ICD-10-CM | POA: Diagnosis not present

## 2023-05-22 DIAGNOSIS — M9903 Segmental and somatic dysfunction of lumbar region: Secondary | ICD-10-CM

## 2023-05-22 DIAGNOSIS — M25512 Pain in left shoulder: Secondary | ICD-10-CM

## 2023-05-22 DIAGNOSIS — M9904 Segmental and somatic dysfunction of sacral region: Secondary | ICD-10-CM

## 2023-05-22 NOTE — Patient Instructions (Signed)
Shoulder HEP  -Recommend gradual return to physical activity.  Start activity at 50% (speed, duration, reps, sets, intensity) and allow 24 hours to assess for worsening pain.  If 50% is well-tolerated, may increase next activity to 75%.  If 75% is well-tolerated, may increase next physical activity to 100%.  If any of these levels cause pain, recommend dropping down to previous level for an additional 2-3 attempts before advancing. Tylenol for day to day pain relief  4 week follow up

## 2023-06-19 NOTE — Progress Notes (Unsigned)
    Shawn Gray D.[facility] [address] [place] [address] Phone: [phone]   Assessment and Plan:     There are no diagnoses linked to this encounter.  ***   Pertinent previous records reviewed include ***    Follow Up: ***     Subjective:   I, Shawn Gray, am serving as a Neurosurgeon for Doctor [employer]   Chief Complaint: OMT    HPI:    Shawn Gray is a 32 y.o. male coming in with complaint of back and neck pain. OMT on 11/21/2022. Patient states that the standing desk has been helpful to reduce his back pain.    Pain over top of foot continues to bother him daily. Pain radiates down into toes. Pain improves when he wears tighter shoes.    Patient states that L shoulder is painful when he does flys.    Medications patient has been prescribed:    05/22/2023 Patient states is in really bad back flare. Took a muscle relaxer and those helped a little    06/20/2023 Patient states   Relevant Historical Information: Hypertension  Additional pertinent review of systems negative.   Current Outpatient Medications:    amLODipine (NORVASC) 5 MG tablet, Take 1 tablet (5 mg total) by mouth daily., Disp: 30 tablet, Rfl: 1   meloxicam (MOBIC) 15 MG tablet, Take 1 tablet (15 mg total) by mouth daily., Disp: 30 tablet, Rfl: 0   metFORMIN (GLUCOPHAGE-XR) 500 MG 24 hr tablet, TAKE 1 TABLET BY MOUTH EVERY DAY WITH BREAKFAST, Disp: 30 tablet, Rfl: 0   Vitamin D, Ergocalciferol, (DRISDOL) 1.25 MG (50000 UNIT) CAPS capsule, Take 1 capsule (50,000 Units total) by mouth every 7 (seven) days., Disp: 5 capsule, Rfl: 0   Objective:     There were no vitals filed for this visit.    There is no height or weight on file to calculate BMI.    Physical Exam:    ***   Electronically signed by:  Shawn Gray [facility] 7:27 AM 06/19/23

## 2023-06-20 ENCOUNTER — Ambulatory Visit: Payer: No Typology Code available for payment source | Admitting: Sports Medicine

## 2023-06-25 ENCOUNTER — Ambulatory Visit (INDEPENDENT_AMBULATORY_CARE_PROVIDER_SITE_OTHER): Payer: No Typology Code available for payment source

## 2023-06-25 ENCOUNTER — Ambulatory Visit (INDEPENDENT_AMBULATORY_CARE_PROVIDER_SITE_OTHER): Payer: No Typology Code available for payment source | Admitting: Sports Medicine

## 2023-06-25 VITALS — HR 104 | Ht 71.0 in | Wt 309.0 lb

## 2023-06-25 DIAGNOSIS — M7552 Bursitis of left shoulder: Secondary | ICD-10-CM | POA: Diagnosis not present

## 2023-06-25 DIAGNOSIS — M25512 Pain in left shoulder: Secondary | ICD-10-CM

## 2023-06-25 MED ORDER — MELOXICAM 15 MG PO TABS: 15.0000 mg | ORAL_TABLET | Freq: Every day | ORAL | 0 refills | Status: DC

## 2023-06-25 NOTE — Patient Instructions (Signed)
-   Start meloxicam 15 mg daily x2 weeks.  If still having pain after 2 weeks, complete 3rd-week of NSAID. May use remaining NSAID as needed once daily for pain control.  Do not to use additional over-the-counter NSAIDs (ibuprofen, naproxen, Advil, Aleve) while taking prescription NSAIDs.  May use Tylenol [phone] mg 2 to 3 times a day for breakthrough pain. Continue HEP  PT referral  4 week follow up

## 2023-06-25 NOTE — Progress Notes (Signed)
    Shawn Gray [name] [address] [place] [address] Phone: [phone]   Assessment and Plan:     1. Acute pain of left shoulder 2. Subacromial bursitis of left shoulder joint -Subacute, unchanged, subsequent visit - Still most consistent with rotator cuff pathology such as subacromial bursitis versus impingement syndrome - Patient had significant relief for 1 week after subacromial CSI, however pain is returned since that time - Start meloxicam 15 mg daily x2 weeks.  If still having pain after 2 weeks, complete 3rd-week of NSAID. May use remaining NSAID as needed once daily for pain control.  Do not to use additional over-the-counter NSAIDs (ibuprofen, naproxen, Advil, Aleve) while taking prescription NSAIDs.  May use Tylenol [phone] mg 2 to 3 times a day for breakthrough pain. - Continue HEP and start physical therapy.  Referral sent - X-ray obtained in clinic.  My interpretation: No acute fracture or dislocation.   Pertinent previous records reviewed include none  Follow Up: 4 weeks for reevaluation.  If no improvement or worsening of symptoms, could consider alternative injections versus prednisone course versus advanced imaging   Subjective:   I, Shawn Gray, am serving as a Neurosurgeon for Doctor [name]  Chief Complaint: left shoulder pain   HPI:   06/25/23 Patient is a 32 year old male with left shoulder pain. Patient states he has had pain for a few weeks. Has been having more flares of pain since he has been driving more. Ibu doesn't help with the pain. He is a left side sleeper. No numbness or tingling. Decreased ROM. Has noted some weakness when using above his head    Relevant Historical Information: Hypertension, prediabetes  Additional pertinent review of systems negative.   Current Outpatient Medications:    amLODipine (NORVASC) 5 MG tablet, Take 1 tablet (5 mg total) by mouth daily., Disp: 30 tablet,  Rfl: 1   meloxicam (MOBIC) 15 MG tablet, Take 1 tablet (15 mg total) by mouth daily., Disp: 30 tablet, Rfl: 0   meloxicam (MOBIC) 15 MG tablet, Take 1 tablet (15 mg total) by mouth daily., Disp: 30 tablet, Rfl: 0   metFORMIN (GLUCOPHAGE-XR) 500 MG 24 hr tablet, TAKE 1 TABLET BY MOUTH EVERY DAY WITH BREAKFAST, Disp: 30 tablet, Rfl: 0   Vitamin D, Ergocalciferol, (DRISDOL) 1.25 MG (50000 UNIT) CAPS capsule, Take 1 capsule (50,000 Units total) by mouth every 7 (seven) days., Disp: 5 capsule, Rfl: 0   Objective:     Vitals:   06/25/23 1533  Pulse: (!) 104  SpO2: 99%  Weight: (!) 309 lb (140.2 kg)  Height: 5\' 11"  (1.803 m)      Body mass index is 43.1 kg/m.    Physical Exam:    Gen: Appears well, nad, nontoxic and pleasant Neuro:sensation intact, strength is 5/5 with df/pf/inv/ev, muscle tone wnl Skin: no suspicious lesion or defmority Psych: A&O, appropriate mood and affect   Left shoulder:  No deformity, swelling or muscle wasting No scapular winging FF 180, abd 180 with painful arc, int 0, ext 90 NTTP over the [place], clavicle, ac, coracoid, biceps groove, humerus, deltoid, trapezius, cervical spine Positive empty can Neg neer, hawkins,  obriens, crossarm, subscap liftoff, speeds Neg ant drawer, sulcus sign, apprehension Negative Spurling's test bilat FROM of neck     Electronically signed by:  Shawn Gray [name] 4:29 PM 06/25/23

## 2023-07-12 ENCOUNTER — Ambulatory Visit: Payer: No Typology Code available for payment source

## 2023-07-15 ENCOUNTER — Other Ambulatory Visit: Payer: Self-pay

## 2023-07-15 ENCOUNTER — Encounter: Payer: Self-pay | Admitting: Physical Therapy

## 2023-07-15 ENCOUNTER — Ambulatory Visit: Payer: No Typology Code available for payment source | Admitting: Physical Therapy

## 2023-07-15 VITALS — BP 158/104 | HR 80

## 2023-07-15 DIAGNOSIS — M25512 Pain in left shoulder: Secondary | ICD-10-CM

## 2023-07-15 NOTE — Therapy (Signed)
[facility] [address] [place], [place], [address] Phone: [phone]   Fax:  [phone]  Patient Details  Name: Shawn Gray MRN: [id number] Date of Birth: 30-Dec-1990 Referring Provider:  [name], DO  Encounter Date: 07/15/2023  Session was arrive no charge. Patient BP out of range. Assessed 2x; patient reports history of elevated readings in certain healthcare settings. Reports not currently on BP medications as readings range at home versus in various health settings. Patient BP readings did drop with deep breathing with eyes closed but still elevated. Recommend follow up with PCP for management and to call back to get back on the schedule. Patient asymptomatic during session.   Vitals:   07/15/23 1544 07/15/23 1548  BP: (!) 173/113 (!) 158/104  Pulse: 80    Carmelia Bake, PT, DPT 07/15/2023, 4:09 PM  [facility] [address] [place], [place], [address] Phone: [phone]   Fax:  [phone]

## 2023-07-22 NOTE — Progress Notes (Signed)
 Shawn Gray [facility] [address] [place] [address] Phone: [phone]   Assessment and Plan:     1. Primary hypertension -Chronic with exacerbation, initial visit - Patient has had significantly elevated systolic and diastolic blood pressure for 1+ year, commonly ranging from 140-160/85-105.  he states that he worked with his PCP in the past and trialed blood pressure medication, however he had significant side effects, so he stopped using medication.  I reassured patient that there are multiple blood pressure medication categories that he could trial so that he could find a medication that he tolerates.  Stressed the importance of hypertension control and following up with a PCP to start medication  2. Acute pain of left shoulder 3. Subacromial bursitis of left shoulder joint -Subacute, improving, subsequent visit - Overall improvement in left shoulder pain with subacromial CSI, meloxicam  course, HEP - Discontinue meloxicam  15 mg daily - Start Tylenol  500 to 1000 mg tablets 2-3 times a day for day-to-day pain relief - Continue HEP and physical therapy  4. Chronic left-sided low back pain without sciatica 5. Acute left-sided thoracic back pain  -Chronic with exacerbation, subsequent visit - Continued intermittent low back pain and newer upper left-sided back pain likely caused from compensation treating left shoulder pain - Start Tylenol  500 to 1000 mg tablets 2-3 times a day for day-to-day pain relief  - Start HEP  15 additional minutes spent for educating Therapeutic Home Exercise Program.  This included exercises focusing on stretching, strengthening, with focus on eccentric aspects.   Long term goals include an improvement in range of motion, strength, endurance as well as avoiding reinjury. Patient's frequency would include in 1-2 times a day, 3-5 times a week for a duration of 6-12 weeks. Proper technique shown and discussed handout  in great detail with ATC.  All questions were discussed and answered.    Pertinent previous records reviewed include blood pressure readings from 06/08/2022 through 07/15/2023  Follow Up: As needed.  Could consider discussing OMT versus repeat physical therapy versus trigger point injections   Subjective:   I, [name], am serving as a neurosurgeon for Doctor Morene Mace   Chief Complaint: left shoulder pain    HPI:    06/25/23 Patient is a 33 year old male with left shoulder pain. Patient states he has had pain for a few weeks. Has been having more flares of pain since he has been driving more. Ibu doesn't help with the pain. He is a left side sleeper. No numbness or tingling. Decreased ROM. Has noted some weakness when using above his head    07/23/2023 Patient states he is okay. Pain has decreased some . He notices it the most with shoulder press. Back wise he is okay    Relevant Historical Information: Hypertension, prediabetes  Additional pertinent review of systems negative.   Current Outpatient Medications:    amLODipine  (NORVASC ) 5 MG tablet, Take 1 tablet (5 mg total) by mouth daily., Disp: 30 tablet, Rfl: 1   meloxicam  (MOBIC ) 15 MG tablet, Take 1 tablet (15 mg total) by mouth daily., Disp: 30 tablet, Rfl: 0   meloxicam  (MOBIC ) 15 MG tablet, Take 1 tablet (15 mg total) by mouth daily., Disp: 30 tablet, Rfl: 0   metFORMIN  (GLUCOPHAGE -XR) 500 MG 24 hr tablet, TAKE 1 TABLET BY MOUTH EVERY DAY WITH BREAKFAST, Disp: 30 tablet, Rfl: 0   Vitamin D , Ergocalciferol , (DRISDOL ) 1.25 MG (50000 UNIT) CAPS capsule, Take 1 capsule (50,000  Units total) by mouth every 7 (seven) days., Disp: 5 capsule, Rfl: 0   Objective:     Vitals:   07/23/23 1454  BP: 138/88  Pulse: 87  SpO2: 99%  Weight: (!) 315 lb (142.9 kg)  Height: 5' 11 (1.803 m)      Body mass index is 43.93 kg/m.    Physical Exam:    Gen: Appears well, nad, nontoxic and pleasant Psych: Alert and oriented,  appropriate mood and affect Neuro: sensation intact, strength is 5/5 in upper and lower extremities, muscle tone wnl Skin: no susupicious lesions or rashes  Back - Normal skin, Spine with normal alignment and no deformity.   No tenderness to vertebral process palpation.   Bilateral thoracic paraspinous muscles are mildly tender and without spasm No scapular winging TTP bilateral rhomboid, right levator, bilateral trapezius,  Electronically signed by:  Odis Mace [facility] 3:18 PM 07/23/23

## 2023-07-23 ENCOUNTER — Telehealth: Payer: Self-pay | Admitting: Family Medicine

## 2023-07-23 ENCOUNTER — Ambulatory Visit (INDEPENDENT_AMBULATORY_CARE_PROVIDER_SITE_OTHER): Payer: No Typology Code available for payment source | Admitting: Sports Medicine

## 2023-07-23 VITALS — BP 138/88 | HR 87 | Ht 71.0 in | Wt 315.0 lb

## 2023-07-23 DIAGNOSIS — M546 Pain in thoracic spine: Secondary | ICD-10-CM | POA: Diagnosis not present

## 2023-07-23 DIAGNOSIS — M7552 Bursitis of left shoulder: Secondary | ICD-10-CM | POA: Diagnosis not present

## 2023-07-23 DIAGNOSIS — M545 Low back pain, unspecified: Secondary | ICD-10-CM | POA: Diagnosis not present

## 2023-07-23 DIAGNOSIS — M25512 Pain in left shoulder: Secondary | ICD-10-CM | POA: Diagnosis not present

## 2023-07-23 DIAGNOSIS — I10 Essential (primary) hypertension: Secondary | ICD-10-CM

## 2023-07-23 DIAGNOSIS — G8929 Other chronic pain: Secondary | ICD-10-CM

## 2023-07-23 NOTE — Patient Instructions (Signed)
 Tylenol [phone] mg 2-3 times a day for pain relief  Back HEP Follow up with PCP for blood pressure  Continue activity as tolerated  As needed follow up

## 2023-07-23 NOTE — Telephone Encounter (Signed)
 Patient is requesting to transfer care from Leita Elbe, NP to Corean Ku, NP due to location.   Leita, is this okay with you?  Corean, is this okay with you? (If so, would I be able to schedule him sooner than first available for new patient/transfer of care? Patient saw Dr Leonce today and he would like for him to be seen soon due to blood pressure issues.)  Please advise.

## 2023-07-24 NOTE — Telephone Encounter (Signed)
 Sent MyChart message to patient

## 2023-07-24 NOTE — Telephone Encounter (Addendum)
Tried to call patient. No answer and VM is full.

## 2023-08-01 ENCOUNTER — Emergency Department (HOSPITAL_COMMUNITY): Payer: PRIVATE HEALTH INSURANCE

## 2023-08-01 ENCOUNTER — Emergency Department (HOSPITAL_COMMUNITY)
Admission: EM | Admit: 2023-08-01 | Discharge: 2023-08-01 | Disposition: A | Discharge status: Home / Self Care | Payer: PRIVATE HEALTH INSURANCE | Attending: Emergency Medicine | Admitting: Emergency Medicine

## 2023-08-01 ENCOUNTER — Encounter (HOSPITAL_COMMUNITY): Payer: Self-pay

## 2023-08-01 ENCOUNTER — Other Ambulatory Visit: Payer: Self-pay

## 2023-08-01 DIAGNOSIS — R079 Chest pain, unspecified: Secondary | ICD-10-CM

## 2023-08-01 DIAGNOSIS — R519 Headache, unspecified: Secondary | ICD-10-CM | POA: Diagnosis present

## 2023-08-01 DIAGNOSIS — I10 Essential (primary) hypertension: Secondary | ICD-10-CM | POA: Diagnosis not present

## 2023-08-01 DIAGNOSIS — Z79899 Other long term (current) drug therapy: Secondary | ICD-10-CM | POA: Diagnosis not present

## 2023-08-01 LAB — COMPREHENSIVE METABOLIC PANEL
ALT: 26 U/L (ref 0–44)
AST: 21 U/L (ref 15–41)
Albumin: 3.9 g/dL (ref 3.5–5.0)
Alkaline Phosphatase: 75 U/L (ref 38–126)
Anion gap: 8 (ref 5–15)
BUN: 10 mg/dL (ref 6–20)
CO2: 24 mmol/L (ref 22–32)
Calcium: 9.3 mg/dL (ref 8.9–10.3)
Chloride: 106 mmol/L (ref 98–111)
Creatinine, Ser: 1.11 mg/dL (ref 0.61–1.24)
GFR, Estimated: >60 mL/min (ref >60)
Glucose, Bld: 87 mg/dL (ref 70–99)
Potassium: 3.6 mmol/L (ref 3.5–5.1)
Sodium: 138 mmol/L (ref 135–145)
Total Bilirubin: 1 mg/dL (ref 0.0–1.2)
Total Protein: 7.4 g/dL (ref 6.5–8.1)

## 2023-08-01 LAB — I-STAT CHEM 8, ED
BUN: 13 mg/dL (ref 6–20)
Calcium, Ion: 1.15 mmol/L (ref 1.15–1.40)
Chloride: 104 mmol/L (ref 98–111)
Creatinine, Ser: 1.2 mg/dL (ref 0.61–1.24)
Glucose, Bld: 93 mg/dL (ref 70–99)
HCT: 45 % (ref 39.0–52.0)
Hemoglobin: 15.3 g/dL (ref 13.0–17.0)
Potassium: 4.1 mmol/L (ref 3.5–5.1)
Sodium: 140 mmol/L (ref 135–145)
TCO2: 29 mmol/L (ref 22–32)

## 2023-08-01 LAB — CBC
HCT: 44.8 % (ref 39.0–52.0)
Hemoglobin: 14.7 g/dL (ref 13.0–17.0)
MCH: 27.1 pg (ref 26.0–34.0)
MCHC: 32.8 g/dL (ref 30.0–36.0)
MCV: 82.5 fL (ref 80.0–100.0)
Platelets: 310 10*3/uL (ref 150–400)
RBC: 5.43 MIL/uL (ref 4.22–5.81)
RDW: 13.5 % (ref 11.5–15.5)
WBC: 7.9 10*3/uL (ref 4.0–10.5)
nRBC: 0 % (ref 0.0–0.2)

## 2023-08-01 LAB — TROPONIN I (HIGH SENSITIVITY)
Troponin I (High Sensitivity): 6 ng/L (ref <18)
Troponin I (High Sensitivity): 6 ng/L (ref <18)

## 2023-08-01 LAB — RAPID URINE DRUG SCREEN, HOSP PERFORMED
Amphetamines: NOT DETECTED
Barbiturates: NOT DETECTED
Benzodiazepines: NOT DETECTED
Cocaine: NOT DETECTED
Opiates: NOT DETECTED
Tetrahydrocannabinol: NOT DETECTED

## 2023-08-01 LAB — ETHANOL: Alcohol, Ethyl (B): <10 mg/dL (ref <10)

## 2023-08-01 NOTE — ED Provider Notes (Signed)
[place] EMERGENCY DEPARTMENT AT [facility] Provider Note   CSN: [id number] Arrival date & time: 08/01/23  1428     History  Chief Complaint  Patient presents with   Hypertension   Chest Pain    Shawn Gray is a 33 y.o. malecomplains of headache chest pain and hypertension. Patient was at [facility]hab [facility] today for a shoulder issue noted to be extremely hypertensive. He has not been taking his medications. He has had headache for the past 2 weeks he states he has a history of this but this is different. Is dull and throbbing. He is also complaining of chest pain but does not feel shortness of breath but is having pain in the left arm. Patient reports that around 7 AM he noticed that he was having unilateral weakness on the left side. That was an acute change. He has never had this in the past with migraine headache. He does however generally get dizziness nausea light sensitivity and visual disturbances.    Hypertension Associated symptoms include chest pain.  Chest Pain      Home Medications Prior to Admission medications   Medication Sig Start Date End Date Taking? Authorizing Provider  amLODipine (NORVASC) 5 MG tablet Take 1 tablet (5 mg total) by mouth daily. 01/09/23   Bowen, Scot Jun, DO  meloxicam (MOBIC) 15 MG tablet Take 1 tablet (15 mg total) by mouth daily. 11/21/22   [name], DO  meloxicam (MOBIC) 15 MG tablet Take 1 tablet (15 mg total) by mouth daily. 06/25/23   [name], DO  metFORMIN (GLUCOPHAGE-XR) 500 MG 24 hr tablet TAKE 1 TABLET BY MOUTH EVERY DAY WITH BREAKFAST 02/06/23   Bowen, Scot Jun, DO  Vitamin D, Ergocalciferol, (DRISDOL) 1.25 MG (50000 UNIT) CAPS capsule Take 1 capsule (50,000 Units total) by mouth every 7 (seven) days. 01/09/23   Bowen, Scot Jun, DO      Allergies    Other and Nickel    Review of Systems   Review of Systems  Cardiovascular:  Positive for chest pain.    Physical Exam Updated Vital  Signs BP (!) 150/117   Pulse 71   Temp 98.4 F (36.9 C) (Oral)   Resp 18   Ht 5\' 11"  (1.803 m)   Wt (!) 142.9 kg   SpO2 100%   BMI 43.93 kg/m  Physical Exam Vitals and nursing note reviewed.  Constitutional:      General: He is not in acute distress.    Appearance: He is well-developed. He is not diaphoretic.  HENT:     Head: Normocephalic and atraumatic.  Eyes:     General: No scleral icterus.    Conjunctiva/sclera: Conjunctivae normal.  Cardiovascular:     Rate and Rhythm: Normal rate and regular rhythm.     Heart sounds: Normal heart sounds.  Pulmonary:     Effort: Pulmonary effort is normal. No respiratory distress.     Breath sounds: Normal breath sounds.  Abdominal:     Palpations: Abdomen is soft.     Tenderness: There is no abdominal tenderness.  Musculoskeletal:     Cervical back: Normal range of motion and neck supple.  Skin:    General: Skin is warm and dry.  Neurological:     Mental Status: He is alert.     Comments: Speech is clear and goal oriented, follows commands Major Cranial nerves without deficit, no facial droop Weakness of the left arm and left leg.  Possibly  functional seems intermittent Sensation normal to light and sharp touch Moves extremities without ataxia, coordination intact Normal finger to nose and rapid alternating movements Neg romberg, no pronator drift Normal gait Normal heel-shin and balance   Psychiatric:        Behavior: Behavior normal.     ED Results / Procedures / Treatments   Labs (all labs ordered are listed, but only abnormal results are displayed) Labs Reviewed  CBC  ETHANOL  COMPREHENSIVE METABOLIC PANEL  RAPID URINE DRUG SCREEN, HOSP PERFORMED  DIFFERENTIAL  I-STAT CHEM 8, ED  TROPONIN I (HIGH SENSITIVITY)  TROPONIN I (HIGH SENSITIVITY)    EKG None  Radiology MR BRAIN WO CONTRAST Result Date: 08/01/2023 CLINICAL DATA:  Neuro deficit, acute, stroke suspected EXAM: MRI HEAD WITHOUT CONTRAST TECHNIQUE:  Multiplanar, multiecho pulse sequences of the brain and surrounding structures were obtained without intravenous contrast. COMPARISON:  None Available. FINDINGS: Brain: No acute infarction, hemorrhage, hydrocephalus, extra-axial collection or mass lesion. Vascular: Normal flow voids. Skull and upper cervical spine: Normal marrow signal. Sinuses/Orbits: Negative. Other: None. IMPRESSION: Normal brain MRI. No acute abnormality. Electronically Signed   By: Feliberto Harts M.D.   On: 08/01/2023 17:53   DG Chest 2 View Result Date: 08/01/2023 CLINICAL DATA:  Left-sided chest pain. EXAM: CHEST - 2 VIEW COMPARISON:  Chest x-ray dated June 08, 2022. FINDINGS: The heart size and mediastinal contours are within normal limits. Both lungs are clear. The visualized skeletal structures are unremarkable. IMPRESSION: No active cardiopulmonary disease. Electronically Signed   By: Obie Dredge M.D.   On: 08/01/2023 16:26    Procedures Procedures    Medications Ordered in ED Medications - No data to display  ED Course/ Medical Decision Making/ A&P Clinical Course as of 08/01/23 2203  Thu Aug 01, 2023  1533 Case discussed with Dr. Amada Jupiter.  Out of the window for code stroke.  MRI ordered as discussed with neurology. [AH]    Clinical Course User Index [AH] Arthor Captain, PA-C                                 Medical Decision Making Amount and/or Complexity of Data Reviewed Labs: ordered. Radiology: ordered.   This patient presents to the ED for concern of HTN, headache cp,hbp, Left sided weakness, this involves an extensive number of treatment options, and is a complaint that carries with it a high risk of complications and morbidity.  Complicated migraine, stroke, anxiety, hypertensive emergency   Co morbidities:   has a past medical history of Back pain, Bilateral swelling of feet, Chest pain, High blood pressure, Migraine, Obesity, Sinus tarsi syndrome, and Strep throat.   Social  Determinants of Health:   SDOH Screenings   Depression (PHQ2-9): Low Risk  (05/15/2022)  Tobacco Use: Low Risk  (08/01/2023)     Additional history:  {Additional history obtained from friend  {External records from outside source obtained and reviewed including op xr  Lab Tests:  I Ordered, and personally interpreted labs.  The pertinent results include:   No acute abnormalities,troponin cr wnl  Imaging Studies:  I ordered imaging studies including MRI cxr I independently visualized and interpreted imaging which showed no acute findings I agree with the radiologist interpretation    Medicines ordered and prescription drug management:    Test Considered:    Critical Interventions:    Consultations Obtained:  Dr. Amada Jupiter Problem List / ED Course:  ICD-10-CM   1. Poorly-controlled hypertension  I10     2. Bad headache  R51.9     3. Chest pain, unspecified type  R07.9       MDM: patient here with HTN, No evidence of stroke, brain mass, MI< acs, or hypertensive emergency. Safe for dc follow up with pcp for bp measurement   Dispostion:  After consideration of the diagnostic results and the patients response to treatment, I feel that the patent would benefit from discharge.         Final Clinical Impression(s) / ED Diagnoses Final diagnoses:  Poorly-controlled hypertension  Bad headache  Chest pain, unspecified type    Rx / DC Orders ED Discharge Orders     None         Arthor Captain, PA-C 08/01/23 2205    Vanetta Mulders, MD 08/03/23 1527

## 2023-08-01 NOTE — Discharge Instructions (Signed)
Get help right away if you: Develop a severe headache or confusion. Have unusual weakness or numbness. Feel faint. Have severe pain in your chest or abdomen. Vomit repeatedly. Have trouble breathing. These symptoms may be an emergency. Get help right away. Call 911. Do not wait to see if the symptoms will go away. Do not drive yourself to the hospital. 

## 2023-08-01 NOTE — ED Triage Notes (Addendum)
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 Staff gave pt clonodine. Pt reports hx of HTN, Off meds x 6 weeks. Reports intermittent chest pain today.   Pt anxious

## 2023-08-01 NOTE — ED Provider Triage Note (Signed)
Emergency Medicine Provider Triage Evaluation Note  Shawn Gray , a 33 y.o. male  was evaluated in triage.  Pt complains of headache chest pain and hypertension.  Patient was at rehab at his sports medicine center today for a shoulder issue noted to be extremely hypertensive.  He has not been taking his medications.  He has had headache for the past 2 weeks he states he has a history of this but this is different.  Is dull and throbbing.  He is also complaining of chest pain but does not feel shortness of breath but is having pain in the left arm.  Patient reports that around 7 AM he noticed that he was having unilateral weakness on the left side.  That was an acute change.  He has never had this in the past with migraine headache.  He does however generally get dizziness nausea light sensitivity and visual disturbances.  Review of Systems  Positive: Unilateral weakness Negative: Fever  Physical Exam  BP (!) 175/125   Pulse 79   Temp 98.2 F (36.8 C) (Oral)   Resp 18   Ht 5\' 11"  (1.803 m)   Wt (!) 142.9 kg   SpO2 100%   BMI 43.93 kg/m  Gen:   Awake, no distress   Resp:  Normal effort MSK:   Moves extremities without difficulty  Other:  This in the left upper and lower extremity  Medical Decision Making  Medically screening exam initiated at 3:32 PM.  Appropriate orders placed.  Shawn Gray was informed that the remainder of the evaluation will be completed by another provider, this initial triage assessment does not replace that evaluation, and the importance of remaining in the ED until their evaluation is complete.  Case discussed with Dr. Amada Jupiter.  Out of the window for code stroke.  MRI ordered as discussed with neurology.   Arthor Captain, PA-C 08/01/23 1533

## 2023-11-25 ENCOUNTER — Emergency Department: Admission: EM | Admit: 2023-11-25 | Discharge: 2023-11-25 | Disposition: A | Discharge status: Home / Self Care

## 2023-11-25 ENCOUNTER — Other Ambulatory Visit: Payer: Self-pay

## 2023-11-25 ENCOUNTER — Encounter: Payer: Self-pay | Admitting: *Deleted

## 2023-11-25 DIAGNOSIS — R509 Fever, unspecified: Secondary | ICD-10-CM | POA: Diagnosis present

## 2023-11-25 DIAGNOSIS — J020 Streptococcal pharyngitis: Secondary | ICD-10-CM | POA: Diagnosis not present

## 2023-11-25 LAB — GROUP A STREP BY PCR: Group A Strep by PCR: DETECTED — AB

## 2023-11-25 MED ORDER — IBUPROFEN 600 MG PO TABS
600.0000 mg | ORAL_TABLET | Freq: Once | ORAL | Status: AC
  Administered 2023-11-25: 600 mg via ORAL

## 2023-11-25 MED ORDER — AMOXICILLIN 500 MG PO TABS: 500.0000 mg | ORAL_TABLET | Freq: Two times a day (BID) | ORAL | 0 refills | Status: AC

## 2023-11-25 NOTE — Discharge Instructions (Addendum)
 Please take the antibiotics as prescribed. You can take 650 mg of Tylenol and 600 mg of ibuprofen every 6 hours as needed for pain.

## 2023-11-25 NOTE — ED Triage Notes (Signed)
 Pt is here for sore throat since Friday.  Painful to swallow.

## 2023-11-25 NOTE — ED Provider Notes (Signed)
 [facility] Provider Note    Event Date/Time   First MD Initiated Contact with Patient 11/25/23 2302     (approximate)   History   Sore Throat   HPI  Shawn Gray is a 33 y.o. male  presents for evaluation of a sore throat that began on Friday. He has had a fever, pain with swallowing. He noticed "white stuff" in the back of his throat.       Physical Exam   Triage Vital Signs: ED Triage Vitals  Encounter Vitals Group     BP 11/25/23 2214 (!) 165/107     Systolic BP Percentile --      Diastolic BP Percentile --      Pulse Rate 11/25/23 2214 95     Resp 11/25/23 2214 16     Temp 11/25/23 2214 100.2 F (37.9 C)     Temp Source 11/25/23 2214 Oral     SpO2 11/25/23 2214 98 %     Weight --      Height --      Head Circumference --      Peak Flow --      Pain Score 11/25/23 2211 8     Pain Loc --      Pain Education --      Exclude from Growth Chart --     Most recent vital signs: Vitals:   11/25/23 2214  BP: (!) 165/107  Pulse: 95  Resp: 16  Temp: 100.2 F (37.9 C)  SpO2: 98%   General: Awake, no distress.  CV:  Good peripheral perfusion.  Resp:  Normal effort.  Abd:  No distention.  Other:  Pharynx is erythematous, tonsillar swelling bilaterally with white exudates. Cerumen impaction on right side, left TM is translucent.   ED Results / Procedures / Treatments   Labs (all labs ordered are listed, but only abnormal results are displayed) Labs Reviewed  GROUP A STREP BY PCR - Abnormal; Notable for the following components:      Result Value   Group A Strep by PCR DETECTED (*)    All other components within normal limits    PROCEDURES:  Critical Care performed: No  Procedures   MEDICATIONS ORDERED IN ED: Medications  ibuprofen  (ADVIL ) tablet 600 mg (600 mg Oral Given 11/25/23 2218)     IMPRESSION / MDM / ASSESSMENT AND PLAN / ED COURSE  I reviewed the triage vital signs and the nursing notes.                              33 year old male presents for evaluation of a sore throat. BP is elevated but patient does have a history of hypertension and has not taken his medication yet. Patient NAD on exam.   Differential diagnosis includes, but is not limited to, strep throat, viral pharyngitis, URI.  Patient's presentation is most consistent with acute, uncomplicated illness.  Patient tested positive for strep. Will start on oral antibiotics. Discussed symptomatic management as well. Patient voiced understanding, all questions were answered and he was stable at discharge.     FINAL CLINICAL IMPRESSION(S) / ED DIAGNOSES   Final diagnoses:  Strep throat     Rx / DC Orders   ED Discharge Orders          Ordered    amoxicillin  (AMOXIL ) 500 MG tablet  2 times daily        11/25/23  2306             Note:  This document was prepared using Dragon voice recognition software and may include unintentional dictation errors.   Phyliss Breen, PA-C 11/25/23 2312    Collis Deaner, MD 11/25/23 2325

## 2024-03-17 ENCOUNTER — Encounter: Payer: Self-pay | Admitting: Family

## 2024-03-20 ENCOUNTER — Ambulatory Visit (INDEPENDENT_AMBULATORY_CARE_PROVIDER_SITE_OTHER): Admitting: Family

## 2024-03-20 ENCOUNTER — Encounter: Payer: Self-pay | Admitting: Family

## 2024-03-20 DIAGNOSIS — I10 Essential (primary) hypertension: Secondary | ICD-10-CM | POA: Diagnosis not present

## 2024-03-20 MED ORDER — AMLODIPINE BESYLATE 5 MG PO TABS: 5.0000 mg | ORAL_TABLET | Freq: Every day | ORAL | 1 refills | Status: DC

## 2024-03-20 NOTE — Progress Notes (Signed)
 Shawn Gray is a 33 y.o. male with the following history as recorded in [employer]:  Patient Active Problem List   Diagnosis Date Noted   Primary hypertension 01/09/2023   Sleep-disordered breathing 01/09/2023   Pure hypercholesterolemia 11/19/2022   Vitamin D  deficiency 11/19/2022   B12 deficiency 11/19/2022   Morbid obesity (HCC) with starting BMI 45 09/04/2022   Central adiposity 09/04/2022   Prediabetes 09/04/2022   Sinus tarsi syndrome of right ankle 08/14/2022   Sprain of shoulder, left 08/14/2022   Peroneal tendinitis of lower leg, right 04/11/2021   Low back pain 10/17/2020   Nonallopathic lesion of sacral region 10/17/2020   Nonallopathic lesion of thoracic region 10/17/2020   Nonallopathic lesion of lumbar region 10/17/2020    Current Outpatient Medications  Medication Sig Dispense Refill   amLODipine  (NORVASC ) 5 MG tablet Take 1 tablet (5 mg total) by mouth daily. 30 tablet 1   No current facility-administered medications for this visit.    Allergies: Other and Nickel  Past Medical History:  Diagnosis Date   Back pain    Bilateral swelling of feet    Chest pain    High blood pressure    Migraine    Obesity    Sinus tarsi syndrome    Strep throat     Past Surgical History:  Procedure Laterality Date   MOUTH SURGERY      Family History  Problem Relation Age of Onset   Thyroid disease Mother    Stroke Mother    Diabetes Mother    Hypertension Mother    Anxiety disorder Mother    Sleep apnea Mother    Obesity Mother    Hypertension Father    Diabetes Father    Alcoholism Father    Heart disease Maternal Grandmother    Cancer Maternal Grandfather    Diabetes Paternal Grandmother     Social History   Tobacco Use   Smoking status: Never   Smokeless tobacco: Never  Substance Use Topics   Alcohol use: Yes    Comment: occ    Subjective:   Patient was last seen here in October 2023 and refused blood pressure medication at that time; had gotten  prescription for Amlodipine  from his weight loss provider in 2024 for total of 60 pills ( #30/ 1 Rf); went to ER in January 2025 with elevated blood pressure and was told to get back on his blood pressure medication; unfortunately, it does not look like he was able to follow up with anyone regarding his blood pressure; he had called earlier this week saying he needed a refill and was told that office visit was needed;   He is planning to establish primary care with [facility] and will be able to seen there in 1 month; per patient, when I am on my medication, my blood pressure is very good.   Objective:  Vitals:   03/20/24 1539 03/20/24 1609  BP: (!) 164/106 (!) 164/106  Pulse: 86   SpO2: 97%   Weight: (!) 301 lb (136.5 kg)   Height: 5' 11 (1.803 m)     General: Well developed, well nourished, in no acute distress  Skin : Warm and dry.  Head: Normocephalic and atraumatic  Lungs: Respirations unlabored; clear to auscultation bilaterally without wheeze, rales, rhonchi  CVS exam: normal rate and regular rhythm.  Neurologic: Alert and oriented; speech intact; face symmetrical; moves all extremities well; CNII-XII intact without focal deficit   Assessment:  1. Primary hypertension  Plan:  ? Compliance; patient has been counseled to take his blood pressure medication everyday as prescribed; refill updated; stressed the need to get established with new PCP at [facility] and follow up in 1 month for re-check;   No follow-ups on file.  No orders of the defined types were placed in this encounter.   Requested Prescriptions   Signed Prescriptions Disp Refills   amLODipine  (NORVASC ) 5 MG tablet 30 tablet 1    Sig: Take 1 tablet (5 mg total) by mouth daily.

## 2024-03-20 NOTE — Patient Instructions (Signed)
 Please go ahead and get set up for your TOC at [facility] to follow up in the next month.

## 2024-05-25 ENCOUNTER — Encounter: Payer: Self-pay | Admitting: Podiatry

## 2024-05-25 ENCOUNTER — Ambulatory Visit (INDEPENDENT_AMBULATORY_CARE_PROVIDER_SITE_OTHER): Payer: PRIVATE HEALTH INSURANCE | Admitting: Podiatry

## 2024-05-25 DIAGNOSIS — M2042 Other hammer toe(s) (acquired), left foot: Secondary | ICD-10-CM

## 2024-05-25 DIAGNOSIS — M2041 Other hammer toe(s) (acquired), right foot: Secondary | ICD-10-CM

## 2024-05-25 DIAGNOSIS — B351 Tinea unguium: Secondary | ICD-10-CM

## 2024-05-25 NOTE — Progress Notes (Signed)
 Chief Complaint  Patient presents with   Nail Problem    Thick discolored toenails L2 and 5, R1,2,3,5. Non diabetic. Did a topical antifungal x 6 months ago.    Discussed the use of AI scribe software for clinical note transcription with the patient, who gave verbal consent to proceed.  History of Present Illness Shawn Gray is a 33 year old male who presents with toenail pain and discoloration, suspected to be due to fungal infection.  He has been experiencing pain in the second toenail on both feet which is the most troublesome. The pain has persisted for about five years, with the nails becoming darker over time. Pain can occur even without direct pressure, such as when sitting down.  He has also noticed this on the right great toenail  He has been using an over-the-counter antifungal nail solution to manage the condition. There is no history of old injuries or nails falling off. The toenails have a curvature, which has been present for a long time.  No pain at the base of the nail and no pain with pressure applied in certain directions. He has good circulation in the affected area.  He is planning a cruise with his family, including his son and mother, which will be his son's first out-of-country vacation. He is excited about the trip and plans to visit [address], [place], and [place].  He discusses his experiences with pedicures, noting a preference for spa-level services and caution about aggressive technicians. He prefers dry pedicures over wet ones to avoid potential infections.  Past Medical History:  Diagnosis Date   Back pain    Bilateral swelling of feet    Chest pain    High blood pressure    Migraine    Obesity    Sinus tarsi syndrome    Strep throat    Past Surgical History:  Procedure Laterality Date   MOUTH SURGERY     Allergies  Allergen Reactions   Other Anaphylaxis    Tree nuts   Nickel Rash    Topical allergy causes burning   Physical  Exam EXTREMITIES: Fungal changes in right first, second, and possibly third toenails, as well as the left second toenail.  Flexible contracture of bilateral second toes with nail dystrophy.  Palpable pedal pulses noted.  No periungual erythema is appreciated.  No periungual ecchymosis is noted.  There is hyperpigmentation, 3 to 5 mm of thickening, subungual debris and distal onycholysis of the bilateral second and right first toenails.  Epicritic sensation intact   Results Procedure: Nail Clipping   Description: Clippings obtained from toenails utilizing sterile nail nippers and a power debriding burr for laboratory analysis.   Assessment/Plan of Care: 1. Fungal nail infection   2. Hammertoe of second toe of left foot   3. Hammertoe of second toe of right foot     Assessment & Plan Onychomycosis and nail dystrophy of right first, second, and third toenails Chronic onychomycosis and nail dystrophy affecting the right first, second, and third toenails for approximately five years. The second toenail is most symptomatic, with pain exacerbated by sitting. The first toenail occasionally causes discomfort. The third toenail shows signs of fungal infection. The condition is likely due to a combination of fungal infection and nail dystrophy, with the latter possibly exacerbated by the curvature of the toes. Differential diagnosis includes dermatophyte, saprophyte, or yeast infection. Nail dystrophy may result from chronic microtrauma due to toe curvature, leading to nail thickening and potential fungal infection. -  Took clippings of the affected toenails for laboratory analysis to identify the causative organism.  These were sent to [facility] labs for fungal nail culture - Filed down the toenails to reduce discomfort and improve appearance. - Will contact him with laboratory results in approximately three weeks to discuss treatment options. - If fungal infection is confirmed, will discuss appropriate  antifungal treatment. - If nail dystrophy is confirmed without fungal infection, will consider nail removal or toe straightening procedures.   Awanda CHARM Imperial, DPM, FACFAS [facility]     [address] [place], [place] [address]                Office [phone]  Fax [phone]

## 2024-06-03 ENCOUNTER — Ambulatory Visit: Payer: Self-pay | Admitting: Podiatry

## 2024-06-03 NOTE — Progress Notes (Signed)
 I sent him a my chart message, as I could not leave a VM  CT

## 2024-06-09 ENCOUNTER — Other Ambulatory Visit: Payer: Self-pay | Admitting: Podiatry

## 2024-06-09 MED ORDER — TERBINAFINE HCL 250 MG PO TABS: 250.0000 mg | ORAL_TABLET | Freq: Every day | ORAL | 0 refills | Status: AC

## 2024-07-17 ENCOUNTER — Other Ambulatory Visit: Payer: Self-pay

## 2024-07-17 ENCOUNTER — Emergency Department
Admission: EM | Admit: 2024-07-17 | Discharge: 2024-07-17 | Disposition: A | Discharge status: Home / Self Care | Payer: PRIVATE HEALTH INSURANCE | Attending: Emergency Medicine | Admitting: Emergency Medicine

## 2024-07-17 ENCOUNTER — Encounter: Payer: Self-pay | Admitting: Emergency Medicine

## 2024-07-17 ENCOUNTER — Emergency Department: Payer: PRIVATE HEALTH INSURANCE

## 2024-07-17 DIAGNOSIS — I10 Essential (primary) hypertension: Secondary | ICD-10-CM | POA: Diagnosis not present

## 2024-07-17 DIAGNOSIS — R000 Tachycardia, unspecified: Secondary | ICD-10-CM | POA: Insufficient documentation

## 2024-07-17 DIAGNOSIS — R509 Fever, unspecified: Secondary | ICD-10-CM | POA: Insufficient documentation

## 2024-07-17 DIAGNOSIS — L732 Hidradenitis suppurativa: Secondary | ICD-10-CM | POA: Insufficient documentation

## 2024-07-17 DIAGNOSIS — R1903 Right lower quadrant abdominal swelling, mass and lump: Secondary | ICD-10-CM | POA: Diagnosis present

## 2024-07-17 LAB — BASIC METABOLIC PANEL WITH GFR
Anion gap: 13 (ref 5–15)
BUN: 8 mg/dL (ref 6–20)
CO2: 25 mmol/L (ref 22–32)
Calcium: 8.7 mg/dL — ABNORMAL LOW (ref 8.9–10.3)
Chloride: 97 mmol/L — ABNORMAL LOW (ref 98–111)
Creatinine, Ser: 1.12 mg/dL (ref 0.61–1.24)
GFR, Estimated: >60 mL/min
Glucose, Bld: 124 mg/dL — ABNORMAL HIGH (ref 70–99)
Potassium: 3.9 mmol/L (ref 3.5–5.1)
Sodium: 135 mmol/L (ref 135–145)

## 2024-07-17 LAB — CBC WITH DIFFERENTIAL/PLATELET
Abs Immature Granulocytes: 0.04 K/uL (ref 0.00–0.07)
Basophils Absolute: 0.1 K/uL (ref 0.0–0.1)
Basophils Relative: 1 %
Eosinophils Absolute: 0.4 K/uL (ref 0.0–0.5)
Eosinophils Relative: 4 %
HCT: 45.3 % (ref 39.0–52.0)
Hemoglobin: 15 g/dL (ref 13.0–17.0)
Immature Granulocytes: 0 %
Lymphocytes Relative: 25 %
Lymphs Abs: 2.5 K/uL (ref 0.7–4.0)
MCH: 27.1 pg (ref 26.0–34.0)
MCHC: 33.1 g/dL (ref 30.0–36.0)
MCV: 81.8 fL (ref 80.0–100.0)
Monocytes Absolute: 0.9 K/uL (ref 0.1–1.0)
Monocytes Relative: 9 %
Neutro Abs: 6.2 K/uL (ref 1.7–7.7)
Neutrophils Relative %: 61 %
Platelets: 234 K/uL (ref 150–400)
RBC: 5.54 MIL/uL (ref 4.22–5.81)
RDW: 13.2 % (ref 11.5–15.5)
Smear Review: NORMAL
WBC: 10 K/uL (ref 4.0–10.5)
nRBC: 0 % (ref 0.0–0.2)

## 2024-07-17 LAB — HEPATIC FUNCTION PANEL
ALT: 42 U/L (ref 0–44)
AST: 47 U/L — ABNORMAL HIGH (ref 15–41)
Albumin: 4 g/dL (ref 3.5–5.0)
Alkaline Phosphatase: 93 U/L (ref 38–126)
Bilirubin, Direct: <0.1 mg/dL (ref 0.0–0.2)
Total Bilirubin: 0.4 mg/dL (ref 0.0–1.2)
Total Protein: 7.7 g/dL (ref 6.5–8.1)

## 2024-07-17 MED ORDER — MORPHINE SULFATE (PF) 4 MG/ML IV SOLN
4.0000 mg | Freq: Once | INTRAVENOUS | Status: AC
  Administered 2024-07-17: 4 mg via INTRAVENOUS
  Filled 2024-07-17: qty 1

## 2024-07-17 MED ORDER — ACETAMINOPHEN 500 MG PO TABS
1000.0000 mg | ORAL_TABLET | Freq: Once | ORAL | Status: AC
  Administered 2024-07-17: 1000 mg via ORAL
  Filled 2024-07-17: qty 2

## 2024-07-17 MED ORDER — OXYCODONE HCL 5 MG PO TABS: 5.0000 mg | ORAL_TABLET | Freq: Three times a day (TID) | ORAL | 0 refills | Status: DC | PRN

## 2024-07-17 MED ORDER — KETOROLAC TROMETHAMINE 15 MG/ML IJ SOLN
15.0000 mg | Freq: Once | INTRAMUSCULAR | Status: AC
  Administered 2024-07-17: 15 mg via INTRAVENOUS
  Filled 2024-07-17: qty 1

## 2024-07-17 MED ORDER — DOXYCYCLINE HYCLATE 50 MG PO CAPS: 100.0000 mg | ORAL_CAPSULE | Freq: Two times a day (BID) | ORAL | 0 refills | Status: AC

## 2024-07-17 MED ORDER — IOHEXOL 350 MG/ML SOLN
100.0000 mL | Freq: Once | INTRAVENOUS | Status: AC | PRN
  Administered 2024-07-17: 100 mL via INTRAVENOUS

## 2024-07-17 MED ORDER — DOXYCYCLINE HYCLATE 100 MG PO TABS
100.0000 mg | ORAL_TABLET | Freq: Once | ORAL | Status: AC
  Administered 2024-07-17: 100 mg via ORAL
  Filled 2024-07-17: qty 1

## 2024-07-17 NOTE — Discharge Instructions (Addendum)
 Take doxycycline as prescribed for the full 7-day course.  For pain you can apply ice for 20 minutes at a time. Take acetaminophen  650 mg and ibuprofen  400 mg every 6 hours for pain.  Take with food. Use oxycodone for severe pain only.  I made a referral to a primary doctor who should reach out to you to schedule a follow-up appointment.  You may also follow-up with your dermatologist that you previously saw in [place]  Thank you for choosing us  for your health care today!  Please see your primary doctor this week for a follow up appointment.   If you have any new, worsening, or unexpected symptoms call your doctor right away or come back to the emergency department for reevaluation.  It was my pleasure to care for you today.   Ginnie EDISON Cyrena, MD

## 2024-07-17 NOTE — ED Triage Notes (Signed)
 Pt presents to the ED via POV with complaints of R sided inguinal hernia. Rates the pain 9/10. No meds taken PTA. Endorses frequent urination. A&Ox4 at this time. Denies fevers, CP or SOB.

## 2024-07-17 NOTE — ED Provider Notes (Signed)
 "  [facility] Provider Note    Event Date/Time   First MD Initiated Contact with Patient 07/17/24 0202     (approximate)   History   No chief complaint on file.   HPI  Shawn Gray is a 34 y.o. male   Past medical history of high blood pressure, here with a lump on the right lower quadrant inguinal area.  Right inguinal area has a bulge and pain.  He noticed it after helping his friend move 2 days ago.  Since then has been quite painful.  He has been passing flatus and bowel movements but has had loose stool.  He has had nausea and vomited 1 time.    He has had hernias in the past that self reduced and never needed surgical intervention.    External Medical Documents Reviewed: Prior outpatient notes from September of this year      Physical Exam   Triage Vital Signs: ED Triage Vitals  Encounter Vitals Group     BP 07/17/24 0151 (!) 157/111     Girls Systolic BP Percentile --      Girls Diastolic BP Percentile --      Boys Systolic BP Percentile --      Boys Diastolic BP Percentile --      Pulse Rate 07/17/24 0151 (!) 106     Resp 07/17/24 0151 20     Temp 07/17/24 0151 (!) 100.4 F (38 C)     Temp Source 07/17/24 0151 Oral     SpO2 07/17/24 0151 100 %     Weight 07/17/24 0150 (!) 308 lb (139.7 kg)     Height 07/17/24 0150 6' (1.829 m)     Head Circumference --      Peak Flow --      Pain Score 07/17/24 0150 9     Pain Loc --      Pain Education --      Exclude from Growth Chart --     Most recent vital signs: Vitals:   07/17/24 0151  BP: (!) 157/111  Pulse: (!) 106  Resp: 20  Temp: (!) 100.4 F (38 C)  SpO2: 100%    General: Awake, no distress.  CV:  Good peripheral perfusion.  Resp:  Normal effort.  Abd:  No distention.  Other:  There is a mass to the right lower quadrant of his abdomen, irreducible, and with no overlying skin changes.  Mildly tender to palpation.  Low-grade fever 100.4 and slightly tachycardic.  Testicles  appear normal without swelling or tenderness to palpation.   ED Results / Procedures / Treatments   Labs (all labs ordered are listed, but only abnormal results are displayed) Labs Reviewed  BASIC METABOLIC PANEL WITH GFR - Abnormal; Notable for the following components:      Result Value   Chloride 97 (*)    Glucose, Bld 124 (*)    Calcium 8.7 (*)    All other components within normal limits  HEPATIC FUNCTION PANEL - Abnormal; Notable for the following components:   AST 47 (*)    All other components within normal limits  CBC WITH DIFFERENTIAL/PLATELET     I ordered and reviewed the above labs they are notable for cell counts and electrolytes unremarkable.   RADIOLOGY I independently reviewed and interpreted CT scan of the abdomen and see no obstructive changes but do see inflammatory changes at the area of concern in the right inguinal area I also reviewed radiologist's  formal read.   PROCEDURES:  Critical Care performed: No  Procedures   MEDICATIONS ORDERED IN ED: Medications  doxycycline (VIBRA-TABS) tablet 100 mg (has no administration in time range)  acetaminophen  (TYLENOL ) tablet 1,000 mg (1,000 mg Oral Given 07/17/24 0312)  ketorolac  (TORADOL ) 15 MG/ML injection 15 mg (15 mg Intravenous Given 07/17/24 0315)  morphine (PF) 4 MG/ML injection 4 mg (4 mg Intravenous Given 07/17/24 0315)  iohexol (OMNIPAQUE) 350 MG/ML injection 100 mL (100 mLs Intravenous Contrast Given 07/17/24 0438)    IMPRESSION / MDM / ASSESSMENT AND PLAN / ED COURSE  I reviewed the triage vital signs and the nursing notes.                                Patient's presentation is most consistent with acute presentation with potential threat to life or bodily function.  Differential diagnosis includes, but is not limited to, strangulated or incarcerated hernia, irreducible hernia, bowel obstruction   MDM:    He has a painful mass that may represent inguinal hernia that is causing questionable  obstructive symptoms with less stool output, liquid stools,, worrisome for incarcerated hernia but also has a low-grade fever that I worry may indicate strangulation or obstruction.    He was given pain control, got IV access and labs, and will undergo CT of the abdomen pelvis for further characterization and surgical consultation as needed  Keep NPO.  -- Patient stable.  Fortunately CT scan shows no hernia, obstruction, but does show some inflammatory changes in the area of concern.  Reexamination shows no change.  He notes again no testicular pain, there is no swelling there, no dysuria and no penile discharge I doubt this is a orchitis/STD.  He does now note that he has a had a extensive history of folliculitis and hidradenitis suppurativa for which he used to see a dermatologist.  This very well might represent a similar skin infection.  He does not appear septic and so I have prescribed him a prescription for outpatient antibiotics and he will follow-up with his dermatologist as needed.  I have also made a referral to PMD for follow-up and establishment of care.  I considered hospitalization for admission or observation however given mostly unremarkable workup as above, no indication for admission given no surgical abdominal pathologies, patient in agreement for trial of outpatient antibiotics and follow-up with dermatologist/PMD and knows to return with any new or worsening symptoms.        FINAL CLINICAL IMPRESSION(S) / ED DIAGNOSES   Final diagnoses:  Hidradenitis suppurativa     Rx / DC Orders   ED Discharge Orders          Ordered    Ambulatory Referral to Primary Care (Establish Care)        07/17/24 0603    doxycycline (VIBRAMYCIN) 50 MG capsule  2 times daily        07/17/24 0604    oxyCODONE (ROXICODONE) 5 MG immediate release tablet  Every 8 hours PRN        07/17/24 0605             Note:  This document was prepared using Dragon voice recognition software and  may include unintentional dictation errors.    Cyrena Mylar, MD 07/17/24 [phone]  "

## 2024-08-03 ENCOUNTER — Ambulatory Visit: Payer: PRIVATE HEALTH INSURANCE | Admitting: Podiatry

## 2024-08-03 DIAGNOSIS — M2041 Other hammer toe(s) (acquired), right foot: Secondary | ICD-10-CM | POA: Diagnosis not present

## 2024-08-03 DIAGNOSIS — M2042 Other hammer toe(s) (acquired), left foot: Secondary | ICD-10-CM | POA: Diagnosis not present

## 2024-08-03 DIAGNOSIS — B351 Tinea unguium: Secondary | ICD-10-CM

## 2024-08-03 NOTE — Progress Notes (Signed)
"  °  Subjective:  Patient ID: Shawn Gray, male    DOB: 1990/12/23,  MRN: [id number]  Chief Complaint  Patient presents with   Nail Problem     Fungal nail follow up (requested [facility] location). He feels that the great toes are doing great but the 2nd toes are not progressing.     34 y.o. male presents with the above complaint. History confirmed with patient.  He is taking Lamisil  prescribed by Dr. Awanda, has not had any side effects.  Has completed 1 month and just picked up his second 30-day refill.  Notes thickening and pain in second toenail as well.  Objective:  Physical Exam: warm, good capillary refill, no trophic changes or ulcerative lesions, normal DP and PT pulses, normal sensory exam, and onychomycosis with thickening of the bilateral second toenail mallet toe contracture.   Assessment:   1. Hammertoe of second toe of left foot   2. Hammertoe of second toe of right foot   3. Fungal nail infection      Plan:  Patient was evaluated and treated and all questions answered.  Has had some proximal clearing noted in the big toes, no much progress in the second toenail.  We discussed healthy mallet toe contracture contributes to the deformity of the toenail as well.  He has had 1 month of Lamisil  I recommend he continue the treatment for the next 60 days to complete the course.  I will reevaluate his nail fungal progress in 90 days.    We discussed long-term treatment options including surgical treatment with a flexor tenotomy to reduce the contracture.  He would like to proceed with this today.  Following consent, the bilateral second toe was anesthetized with lidocaine and Marcaine 1.5 cc each.  The left second toe was prepped with Betadine exsanguinated and a tourniquet secured around the base of the toe.  The long flexor tendon was transected with a #11 blade to reduce contracture and gentle dorsiflexion on the toe applied.  This to reduce the contracture.  The toe was bandaged with  Silvadene Telfa gauze and Coban.  A similar but separate procedure was performed on the right second toe as well.  Return in about 3 months (around 11/04/2024) for follow up after nail fungus treatment.   "

## 2024-08-04 ENCOUNTER — Encounter: Payer: Self-pay | Admitting: Podiatry

## 2024-08-11 ENCOUNTER — Ambulatory Visit: Payer: Self-pay

## 2024-08-11 NOTE — Telephone Encounter (Signed)
 FYI Only or Action Required?: FYI only for provider: appointment scheduled on 08/12/24.  Patient was last seen in primary care on 03/20/2024 by Jason Leita Repine, FNP.  Called Nurse Triage reporting Hypertension.  Symptoms began today.  Interventions attempted: Nothing.  Symptoms are: unchanged.  Triage Disposition: Call PCP Within 24 Hours  Patient/caregiver understands and will follow disposition?: Yes    Message from [facility] sent at 08/11/2024  4:58 PM EST  Reason for Triage: Elevated pressure 165/110, headache, tight neck     Reason for Disposition  Ran out of BP medications  Answer Assessment - Initial Assessment Questions Pt called to get established at [facility] and for acute visit for elevated BP d/t needing rx refill of amlodipine . PT states BP as of 2h ago was 165/110 and he does have h/a; no intervention for pain relief at this time. Pt denies SOB, no chest pain, no dizziness or vision changes. Discussed pt should monitor symptoms and tylenol  for symptom relief r/t h/a. Pt states while talking to NT he did find one pill of amlodipine  and is going to take that at this time. Appointment scheduled for evaluation. Patient agrees with plan of care, and will call back if anything changes, or if symptoms worsen.       1. BLOOD PRESSURE: What is your blood pressure? Did you take at least two measurements 5 minutes apart?     165/110 around 3pm   2. ONSET: When did you take your blood pressure?     Today around 3pm after having worsening h/a and no BP medications   3. HOW: How did you take your blood pressure? (e.g., automatic home BP monitor, visiting nurse)     Automatic home monitor   4. HISTORY: Do you have a history of high blood pressure?     Yes   5. MEDICINES: Are you taking any medicines for blood pressure? Have you missed any doses recently?     Yes; amlodipine  5mg . Pt stated at the beginning of the call he had not taken rx because he  took his last pill yesterday. He went to get rx filled and was told he had no further refills at this time. Pt stated at end of call, he had went to get the bottle to provide RN with name of rx and there was 1 pill remaining so he was going to take that this evening.   6. OTHER SYMPTOMS: Do you have any symptoms? (e.g., blurred vision, chest pain, difficulty breathing, headache, weakness)     H/a; denies SOB or chest pain  Protocols used: Blood Pressure - High-A-AH

## 2024-08-12 ENCOUNTER — Encounter: Payer: Self-pay | Admitting: Family Medicine

## 2024-08-12 ENCOUNTER — Ambulatory Visit: Payer: PRIVATE HEALTH INSURANCE | Admitting: Internal Medicine

## 2024-08-12 ENCOUNTER — Ambulatory Visit (INDEPENDENT_AMBULATORY_CARE_PROVIDER_SITE_OTHER): Payer: PRIVATE HEALTH INSURANCE | Admitting: Family Medicine

## 2024-08-12 VITALS — BP 158/106 | HR 91 | Temp 99.0°F | Ht 71.0 in | Wt 306.1 lb

## 2024-08-12 DIAGNOSIS — G4733 Obstructive sleep apnea (adult) (pediatric): Secondary | ICD-10-CM | POA: Diagnosis not present

## 2024-08-12 DIAGNOSIS — R7303 Prediabetes: Secondary | ICD-10-CM

## 2024-08-12 DIAGNOSIS — I10 Essential (primary) hypertension: Secondary | ICD-10-CM

## 2024-08-12 LAB — POCT GLYCOSYLATED HEMOGLOBIN (HGB A1C): Hemoglobin A1C: 5.6 % (ref 4.0–5.6)

## 2024-08-12 MED ORDER — AMLODIPINE BESYLATE 10 MG PO TABS: 10.0000 mg | ORAL_TABLET | Freq: Every day | ORAL | 1 refills | Status: AC

## 2024-08-12 NOTE — Progress Notes (Signed)
 "    Lisha Vitale T. Florabelle Cardin, MD, CAQ Sports Medicine [facility] [address] [place], [address]  Phone: [phone]  FAX: [phone]  [name] - 34 y.o. male  MRN [id number]  Date of Birth: 05/15/1991  Date: 08/12/2024  PCP: Watt Mirza, MD  Referral: No ref. provider found  Chief Complaint  Patient presents with   Hypertension   Subjective:   Shawn Gray is a 34 y.o. very pleasant male patient with Body mass index is 42.7 kg/m. who presents with the following:  Discussed the use of AI scribe software for clinical note transcription with the patient, who gave verbal consent to proceed.   History of Present Illness Shawn Gray is a 34 year old male with hypertension who presents with elevated blood pressure.  He has experienced elevated blood pressure since Sunday. He has been taking amlodipine  5 mg but was running low and unable to get a refill. Despite taking the medication, his blood pressure remains high, which is concerning to him. He mentions using Nyquil with honey, which contains phenylephrine. He has not taken any cold medicine recently. No weight gain or use of supplements.  He has a history of prediabetes and recalls his blood glucose was high during a recent hospital visit for a tenectomy on his toes. He is mindful of his diet, avoiding sugar and carbs to manage his weight.  He suspects he may have sleep apnea, as his wife has noticed symptoms such as choking or stopping breathing at night. He has not undergone a sleep study before. Elevating his back during sleep helps but causes neck pain.  He exercises regularly at [employer] and does not smoke or use tobacco products.    Review of Systems is noted in the HPI, as appropriate  Objective:   BP (!) 158/106   Pulse 91   Temp 99 F (37.2 C) (Oral)   Ht 5' 11 (1.803 m)   Wt (!) 306 lb 2 oz (138.9 kg)   SpO2 96%   BMI 42.70 kg/m    GEN: WDWN, NAD,  Non-toxic HEENT: Atraumatic, Normocephalic. Neck supple. No masses. CV: RRR, No M/G/R. No JVD. No thrill. No extra heart sounds. EXTR: No c/c/e NEURO Normal gait.  PSYCH: Normally interactive. Conversant.    Laboratory and Imaging Data:  Assessment and Plan:     ICD-10-CM   1. OSA (obstructive sleep apnea)  G47.33 Ambulatory referral to Pulmonology    2. Primary hypertension  I10 amLODipine  (NORVASC ) 10 MG tablet    3. Prediabetes  R73.03 POCT glycosylated hemoglobin (Hb A1C)     Assessment & Plan Primary hypertension Blood pressure remains elevated despite amlodipine  5 mg. Possible contributing factors include decongestants and potential sleep apnea. No significant side effects from medication. - Increased amlodipine  to 10 mg daily. - Provided 90-day supply with refill. - Will recheck blood pressure 4-6 weeks  Obstructive sleep apnea Suspected due to choking and apnea during sleep. Increased risk due to weight. Symptoms improve with back elevation but cause neck pain. Increases risk of cardiovascular events and mortality. - Referred to sleep specialist for evaluation and possible sleep study. - Discussed potential benefits of CPAP therapy if confirmed.  Prediabetes Elevated blood glucose levels. Recent labs showed slightly elevated glucose. Discussed importance of monitoring and lifestyle modifications. - Ordered A1c test. - Encouraged continued lifestyle modifications including diet and exercise.  Medication Management during today's office visit: Meds ordered this encounter  Medications   amLODipine  (  NORVASC ) 10 MG tablet    Sig: Take 1 tablet (10 mg total) by mouth daily.    Dispense:  90 tablet    Refill:  1   Medications Discontinued During This Encounter  Medication Reason   oxyCODONE  (ROXICODONE ) 5 MG immediate release tablet No longer needed (for PRN medications)   amLODipine  (NORVASC ) 5 MG tablet     Orders placed today for conditions managed today: Orders  Placed This Encounter  Procedures   Ambulatory referral to Pulmonology   POCT glycosylated hemoglobin (Hb A1C)    Disposition: Return for Dr. Watt, complete physical 4-6 weeks.  [employer] speech-to-text software was used for transcription in this dictation.  Possible transcriptional errors can occur using Animal nutritionist.   Signed,  Jacques DASEN. Mantaj Chamberlin, MD   Outpatient Encounter Medications as of 08/12/2024  Medication Sig   terbinafine  (LAMISIL ) 250 MG tablet Take 1 tablet (250 mg total) by mouth daily.   [DISCONTINUED] amLODipine  (NORVASC ) 5 MG tablet Take 1 tablet (5 mg total) by mouth daily.   amLODipine  (NORVASC ) 10 MG tablet Take 1 tablet (10 mg total) by mouth daily.   [DISCONTINUED] oxyCODONE  (ROXICODONE ) 5 MG immediate release tablet Take 1 tablet (5 mg total) by mouth every 8 (eight) hours as needed for up to 8 doses.   No facility-administered encounter medications on file as of 08/12/2024.   "

## 2024-08-20 ENCOUNTER — Encounter: Payer: Self-pay | Admitting: Sleep Medicine

## 2024-08-20 ENCOUNTER — Ambulatory Visit: Payer: PRIVATE HEALTH INSURANCE | Admitting: Sleep Medicine

## 2024-08-20 VITALS — BP 168/92 | HR 93 | Temp 97.8°F | Ht 71.0 in | Wt 305.6 lb

## 2024-08-20 DIAGNOSIS — G4733 Obstructive sleep apnea (adult) (pediatric): Secondary | ICD-10-CM

## 2024-08-20 DIAGNOSIS — I10 Essential (primary) hypertension: Secondary | ICD-10-CM

## 2024-08-20 NOTE — Patient Instructions (Signed)
 SABRA

## 2024-08-20 NOTE — Progress Notes (Unsigned)
 "      Name:Shawn Gray MRN: [id number] DOB: Nov 12, 1990   CHIEF COMPLAINT:  EXCESSIVE DAYTIME SLEEPINESS   HISTORY OF PRESENT ILLNESS: Mr. Shawn Gray is a 34 y.o. w/ a h/o HTN and morbid obesity who presents for c/o loud snoring, witnessed apnea and excessive daytime sleepiness which has been present for several years. Reports nocturnal awakenings due to nocturia, however does not have difficulty falling back to sleep. Denies any significant weight changes. Admits to headaches and dry mouth. Denies RLS symptoms, dream enactment, cataplexy, hypnagogic or hypnapompic hallucinations. Reports a family history of sleep apnea. Denies drowsy driving. Drinks 2 cups of coffee daily, occasional alcohol use, denies tobacco or illicit drug use.   Bedtime 11 pm Sleep onset 10 mins Rise time 5-8 am   EPWORTH SLEEP SCORE 6    08/20/2024    2:24 PM  Results of the Epworth flowsheet  Sitting and reading 0  Watching TV 1  Sitting, inactive in a public place (e.g. a theatre or a meeting) 0  As a passenger in a car for an hour without a break 0  Lying down to rest in the afternoon when circumstances permit 3  Sitting and talking to someone 1  Sitting quietly after a lunch without alcohol 0  In a car, while stopped for a few minutes in traffic 1  Total score 6    PAST MEDICAL HISTORY :   has a past medical history of B12 deficiency (11/19/2022), Migraine, Mixed hyperlipidemia (11/19/2022), Morbid obesity (HCC) with starting BMI 45 (09/04/2022), Prediabetes (09/04/2022), and Primary hypertension (01/09/2023).  has a past surgical history that includes Mouth surgery. Prior to Admission medications  Medication Sig Start Date End Date Taking? Authorizing Provider  amLODipine  (NORVASC ) 10 MG tablet Take 1 tablet (10 mg total) by mouth daily. 08/12/24  Yes Copland, Jacques, MD  terbinafine  (LAMISIL ) 250 MG tablet Take 1 tablet (250 mg total) by mouth daily. 06/09/24  Yes McCaughan, Dia D, DPM    Allergies[1]  FAMILY HISTORY:  family history includes Alcoholism in his father; Anxiety disorder in his mother; Cancer in his maternal grandfather; Diabetes in his father, mother, and paternal grandmother; Heart disease in his maternal grandmother; Hypertension in his father and mother; Obesity in his mother; Sleep apnea in his mother; Stroke in his mother; Thyroid disease in his mother. SOCIAL HISTORY:  reports that he has never smoked. He has never used smokeless tobacco. He reports current alcohol use. He reports that he does not use drugs.   Review of Systems:  Gen:  Denies  fever, sweats, chills weight loss  HEENT: Denies blurred vision, double vision, ear pain, eye pain, hearing loss, nose bleeds, sore throat Cardiac:  No dizziness, chest pain or heaviness, chest tightness,edema, No JVD Resp:   No cough, -sputum production, -shortness of breath,-wheezing, -hemoptysis,  Gi: Denies swallowing difficulty, stomach pain, nausea or vomiting, diarrhea, constipation, bowel incontinence Gu:  Denies bladder incontinence, burning urine Ext:   Denies Joint pain, stiffness or swelling Skin: Denies  skin rash, easy bruising or bleeding or hives Endoc:  Denies polyuria, polydipsia , polyphagia or weight change Psych:   Denies depression, insomnia or hallucinations  Other:  All other systems negative  VITAL SIGNS: BP (!) 162/92   Pulse 93   Temp 97.8 F (36.6 C)   Ht 5' 11 (1.803 m)   Wt (!) 305 lb 9.6 oz (138.6 kg)   SpO2 99%   BMI 42.62 kg/m    Physical Examination:  General Appearance: No distress  EYES PERRLA, EOM intact.   NECK Supple, No JVD Pulmonary: normal breath sounds, No wheezing.  CardiovascularNormal S1,S2.  No m/r/g.   Abdomen: Benign, Soft, non-tender. Skin:   warm, no rashes, no ecchymosis  Extremities: normal, no cyanosis, clubbing. Neuro:without focal findings,  speech normal  PSYCHIATRIC: Mood, affect within normal limits.   ASSESSMENT AND  PLAN  OSA I suspect that OSA is likely present due to clinical presentation. Discussed the consequences of untreated sleep apnea. Advised not to drive drowsy for safety of patient and others. Will complete further evaluation with a home sleep study and follow up to review results.    HTN Stable, on current management. Following with PCP.   Morbid obesity Counseled patient on diet and lifestyle modification.    MEDICATION ADJUSTMENTS/LABS AND TESTS ORDERED: Recommend Sleep Study   Patient  satisfied with Plan of action and management. All questions answered  Follow up to review HST results and treatment plan.   I spent a total of 30 minutes reviewing chart data, face-to-face evaluation with the patient, counseling and coordination of care as detailed above.    Nema Oatley, M.D.  Sleep Medicine [facility]           [1]  Allergies Allergen Reactions   Other Anaphylaxis    Tree nuts   Nickel Rash    Topical allergy causes burning   "

## 2024-09-02 ENCOUNTER — Other Ambulatory Visit: Payer: PRIVATE HEALTH INSURANCE

## 2024-09-09 ENCOUNTER — Encounter: Payer: PRIVATE HEALTH INSURANCE | Admitting: Family Medicine

## 2024-11-02 ENCOUNTER — Ambulatory Visit: Payer: PRIVATE HEALTH INSURANCE | Admitting: Podiatry
